# Patient Record
Sex: Female | Born: 1970 | Hispanic: No | Marital: Married | Smoking: Never smoker
Health system: Southern US, Community
[De-identification: ages and names within clinical notes are randomized; demographics above are authoritative.]

## PROBLEM LIST (undated history)

## (undated) DIAGNOSIS — D649 Anemia, unspecified: Secondary | ICD-10-CM

## (undated) DIAGNOSIS — Z202 Contact with and (suspected) exposure to infections with a predominantly sexual mode of transmission: Secondary | ICD-10-CM

## (undated) DIAGNOSIS — B9689 Other specified bacterial agents as the cause of diseases classified elsewhere: Secondary | ICD-10-CM

## (undated) DIAGNOSIS — T7840XA Allergy, unspecified, initial encounter: Secondary | ICD-10-CM

## (undated) DIAGNOSIS — K219 Gastro-esophageal reflux disease without esophagitis: Secondary | ICD-10-CM

## (undated) DIAGNOSIS — IMO0002 Reserved for concepts with insufficient information to code with codable children: Secondary | ICD-10-CM

## (undated) DIAGNOSIS — N83202 Unspecified ovarian cyst, left side: Secondary | ICD-10-CM

## (undated) DIAGNOSIS — E785 Hyperlipidemia, unspecified: Secondary | ICD-10-CM

## (undated) DIAGNOSIS — N200 Calculus of kidney: Secondary | ICD-10-CM

## (undated) DIAGNOSIS — Z8639 Personal history of other endocrine, nutritional and metabolic disease: Secondary | ICD-10-CM

## (undated) DIAGNOSIS — N760 Acute vaginitis: Secondary | ICD-10-CM

## (undated) DIAGNOSIS — R896 Abnormal cytological findings in specimens from other organs, systems and tissues: Secondary | ICD-10-CM

## (undated) DIAGNOSIS — Z87898 Personal history of other specified conditions: Secondary | ICD-10-CM

## (undated) DIAGNOSIS — E119 Type 2 diabetes mellitus without complications: Secondary | ICD-10-CM

## (undated) DIAGNOSIS — N870 Mild cervical dysplasia: Secondary | ICD-10-CM

## (undated) DIAGNOSIS — R638 Other symptoms and signs concerning food and fluid intake: Secondary | ICD-10-CM

## (undated) HISTORY — DX: Other symptoms and signs concerning food and fluid intake: R63.8

## (undated) HISTORY — DX: Type 2 diabetes mellitus without complications: E11.9

## (undated) HISTORY — DX: Allergy, unspecified, initial encounter: T78.40XA

## (undated) HISTORY — DX: Personal history of other endocrine, nutritional and metabolic disease: Z86.39

## (undated) HISTORY — DX: Contact with and (suspected) exposure to infections with a predominantly sexual mode of transmission: Z20.2

## (undated) HISTORY — DX: Personal history of other specified conditions: Z87.898

## (undated) HISTORY — DX: Mild cervical dysplasia: N87.0

## (undated) HISTORY — DX: Unspecified ovarian cyst, left side: N83.202

## (undated) HISTORY — DX: Acute vaginitis: N76.0

## (undated) HISTORY — DX: Abnormal cytological findings in specimens from other organs, systems and tissues: R89.6

## (undated) HISTORY — DX: Other specified bacterial agents as the cause of diseases classified elsewhere: B96.89

## (undated) HISTORY — DX: Hyperlipidemia, unspecified: E78.5

## (undated) HISTORY — DX: Anemia, unspecified: D64.9

## (undated) HISTORY — PX: OTHER SURGICAL HISTORY: SHX169

## (undated) HISTORY — DX: Gastro-esophageal reflux disease without esophagitis: K21.9

## (undated) HISTORY — DX: Reserved for concepts with insufficient information to code with codable children: IMO0002

---

## 1994-04-10 DIAGNOSIS — N83202 Unspecified ovarian cyst, left side: Secondary | ICD-10-CM

## 1994-04-10 HISTORY — DX: Unspecified ovarian cyst, left side: N83.202

## 1995-04-11 DIAGNOSIS — Z8639 Personal history of other endocrine, nutritional and metabolic disease: Secondary | ICD-10-CM

## 1995-04-11 DIAGNOSIS — IMO0001 Reserved for inherently not codable concepts without codable children: Secondary | ICD-10-CM

## 1995-04-11 DIAGNOSIS — Z202 Contact with and (suspected) exposure to infections with a predominantly sexual mode of transmission: Secondary | ICD-10-CM

## 1995-04-11 HISTORY — DX: Personal history of other endocrine, nutritional and metabolic disease: Z86.39

## 1995-04-11 HISTORY — DX: Reserved for inherently not codable concepts without codable children: IMO0001

## 1995-04-11 HISTORY — DX: Contact with and (suspected) exposure to infections with a predominantly sexual mode of transmission: Z20.2

## 1997-04-10 HISTORY — PX: LAPAROSCOPY: SHX197

## 1997-09-14 ENCOUNTER — Other Ambulatory Visit: Admission: RE | Admit: 1997-09-14 | Discharge: 1997-09-14 | Payer: Self-pay | Admitting: Obstetrics and Gynecology

## 1997-11-23 ENCOUNTER — Other Ambulatory Visit: Admission: RE | Admit: 1997-11-23 | Discharge: 1997-11-23 | Payer: Self-pay | Admitting: Obstetrics and Gynecology

## 1998-01-01 ENCOUNTER — Ambulatory Visit (HOSPITAL_COMMUNITY): Admission: RE | Admit: 1998-01-01 | Discharge: 1998-01-01 | Payer: Self-pay | Admitting: Obstetrics and Gynecology

## 1998-04-28 ENCOUNTER — Other Ambulatory Visit: Admission: RE | Admit: 1998-04-28 | Discharge: 1998-04-28 | Payer: Self-pay | Admitting: Obstetrics and Gynecology

## 1999-04-11 DIAGNOSIS — B9689 Other specified bacterial agents as the cause of diseases classified elsewhere: Secondary | ICD-10-CM

## 1999-04-11 DIAGNOSIS — N760 Acute vaginitis: Secondary | ICD-10-CM

## 1999-04-11 HISTORY — DX: Other specified bacterial agents as the cause of diseases classified elsewhere: B96.89

## 1999-04-11 HISTORY — DX: Other specified bacterial agents as the cause of diseases classified elsewhere: N76.0

## 1999-05-12 ENCOUNTER — Other Ambulatory Visit: Admission: RE | Admit: 1999-05-12 | Discharge: 1999-05-12 | Payer: Self-pay | Admitting: Obstetrics and Gynecology

## 2000-05-23 ENCOUNTER — Other Ambulatory Visit: Admission: RE | Admit: 2000-05-23 | Discharge: 2000-05-23 | Payer: Self-pay | Admitting: Obstetrics and Gynecology

## 2000-12-18 ENCOUNTER — Encounter: Admission: RE | Admit: 2000-12-18 | Discharge: 2001-03-18 | Payer: Self-pay | Admitting: Obstetrics and Gynecology

## 2000-12-27 ENCOUNTER — Inpatient Hospital Stay (HOSPITAL_COMMUNITY): Admission: AD | Admit: 2000-12-27 | Discharge: 2000-12-29 | Payer: Self-pay | Admitting: Obstetrics and Gynecology

## 2002-01-28 ENCOUNTER — Other Ambulatory Visit: Admission: RE | Admit: 2002-01-28 | Discharge: 2002-01-28 | Payer: Self-pay | Admitting: Obstetrics and Gynecology

## 2002-04-10 DIAGNOSIS — N870 Mild cervical dysplasia: Secondary | ICD-10-CM

## 2002-04-10 HISTORY — DX: Mild cervical dysplasia: N87.0

## 2002-08-05 ENCOUNTER — Other Ambulatory Visit: Admission: RE | Admit: 2002-08-05 | Discharge: 2002-08-05 | Payer: Self-pay | Admitting: Obstetrics and Gynecology

## 2003-06-12 ENCOUNTER — Emergency Department (HOSPITAL_COMMUNITY): Admission: EM | Admit: 2003-06-12 | Discharge: 2003-06-12 | Payer: Self-pay | Admitting: *Deleted

## 2003-08-05 ENCOUNTER — Other Ambulatory Visit: Admission: RE | Admit: 2003-08-05 | Discharge: 2003-08-05 | Payer: Self-pay | Admitting: Obstetrics and Gynecology

## 2004-04-10 DIAGNOSIS — R638 Other symptoms and signs concerning food and fluid intake: Secondary | ICD-10-CM

## 2004-04-10 HISTORY — DX: Other symptoms and signs concerning food and fluid intake: R63.8

## 2004-09-01 ENCOUNTER — Other Ambulatory Visit: Admission: RE | Admit: 2004-09-01 | Discharge: 2004-09-01 | Payer: Self-pay | Admitting: Obstetrics and Gynecology

## 2005-09-06 ENCOUNTER — Other Ambulatory Visit: Admission: RE | Admit: 2005-09-06 | Discharge: 2005-09-06 | Payer: Self-pay | Admitting: Obstetrics and Gynecology

## 2005-09-14 ENCOUNTER — Ambulatory Visit (HOSPITAL_COMMUNITY): Admission: RE | Admit: 2005-09-14 | Discharge: 2005-09-14 | Payer: Self-pay | Admitting: Obstetrics and Gynecology

## 2008-04-10 DIAGNOSIS — Z87898 Personal history of other specified conditions: Secondary | ICD-10-CM

## 2008-04-10 HISTORY — DX: Personal history of other specified conditions: Z87.898

## 2008-10-18 DIAGNOSIS — IMO0002 Reserved for concepts with insufficient information to code with codable children: Secondary | ICD-10-CM | POA: Insufficient documentation

## 2010-08-15 ENCOUNTER — Other Ambulatory Visit (HOSPITAL_COMMUNITY): Payer: Self-pay | Admitting: Obstetrics and Gynecology

## 2010-08-15 DIAGNOSIS — Z1231 Encounter for screening mammogram for malignant neoplasm of breast: Secondary | ICD-10-CM

## 2010-08-25 ENCOUNTER — Ambulatory Visit (HOSPITAL_COMMUNITY)
Admission: RE | Admit: 2010-08-25 | Discharge: 2010-08-25 | Disposition: A | Discharge status: Home / Self Care | Payer: 59 | Source: Ambulatory Visit | Attending: Obstetrics and Gynecology | Admitting: Obstetrics and Gynecology

## 2010-08-25 DIAGNOSIS — Z1231 Encounter for screening mammogram for malignant neoplasm of breast: Secondary | ICD-10-CM | POA: Insufficient documentation

## 2010-08-31 ENCOUNTER — Other Ambulatory Visit: Payer: Self-pay | Admitting: Obstetrics and Gynecology

## 2010-08-31 DIAGNOSIS — R928 Other abnormal and inconclusive findings on diagnostic imaging of breast: Secondary | ICD-10-CM

## 2010-09-02 ENCOUNTER — Ambulatory Visit
Admission: RE | Admit: 2010-09-02 | Discharge: 2010-09-02 | Disposition: A | Discharge status: Home / Self Care | Payer: 59 | Source: Ambulatory Visit | Attending: Obstetrics and Gynecology | Admitting: Obstetrics and Gynecology

## 2010-09-02 DIAGNOSIS — R928 Other abnormal and inconclusive findings on diagnostic imaging of breast: Secondary | ICD-10-CM

## 2011-08-03 ENCOUNTER — Telehealth: Payer: Self-pay | Admitting: Obstetrics and Gynecology

## 2011-08-03 NOTE — Telephone Encounter (Signed)
Upcoming appt in July/last seen in 2010

## 2011-08-28 ENCOUNTER — Other Ambulatory Visit: Payer: Self-pay | Admitting: Obstetrics and Gynecology

## 2011-08-28 DIAGNOSIS — Z1231 Encounter for screening mammogram for malignant neoplasm of breast: Secondary | ICD-10-CM

## 2011-09-21 ENCOUNTER — Ambulatory Visit (HOSPITAL_COMMUNITY)
Admission: RE | Admit: 2011-09-21 | Discharge: 2011-09-21 | Disposition: A | Discharge status: Home / Self Care | Payer: 59 | Source: Ambulatory Visit | Attending: Obstetrics and Gynecology | Admitting: Obstetrics and Gynecology

## 2011-09-21 DIAGNOSIS — Z1231 Encounter for screening mammogram for malignant neoplasm of breast: Secondary | ICD-10-CM

## 2011-10-19 ENCOUNTER — Ambulatory Visit (INDEPENDENT_AMBULATORY_CARE_PROVIDER_SITE_OTHER): Payer: 59 | Admitting: Obstetrics and Gynecology

## 2011-10-19 ENCOUNTER — Encounter: Payer: Self-pay | Admitting: Obstetrics and Gynecology

## 2011-10-19 VITALS — BP 126/72 | Ht 65.0 in | Wt 281.0 lb

## 2011-10-19 DIAGNOSIS — IMO0002 Reserved for concepts with insufficient information to code with codable children: Secondary | ICD-10-CM

## 2011-10-19 DIAGNOSIS — Z9889 Other specified postprocedural states: Secondary | ICD-10-CM

## 2011-10-19 DIAGNOSIS — Z87898 Personal history of other specified conditions: Secondary | ICD-10-CM

## 2011-10-19 DIAGNOSIS — Z124 Encounter for screening for malignant neoplasm of cervix: Secondary | ICD-10-CM

## 2011-10-19 DIAGNOSIS — Z8742 Personal history of other diseases of the female genital tract: Secondary | ICD-10-CM

## 2011-10-19 DIAGNOSIS — Z01419 Encounter for gynecological examination (general) (routine) without abnormal findings: Secondary | ICD-10-CM

## 2011-10-19 DIAGNOSIS — E669 Obesity, unspecified: Secondary | ICD-10-CM

## 2011-10-19 DIAGNOSIS — R87811 Vaginal high risk human papillomavirus (HPV) DNA test positive: Secondary | ICD-10-CM

## 2011-10-19 HISTORY — DX: Encounter for gynecological examination (general) (routine) without abnormal findings: Z01.419

## 2011-10-19 LAB — COMPREHENSIVE METABOLIC PANEL
AST: 14 U/L (ref 0–37)
BUN: 7 mg/dL (ref 6–23)
CO2: 25 mEq/L (ref 19–32)
Sodium: 138 mEq/L (ref 135–145)
Total Bilirubin: 0.4 mg/dL (ref 0.3–1.2)
Total Protein: 7.7 g/dL (ref 6.0–8.3)

## 2011-10-19 LAB — CBC
HCT: 37.6 % (ref 36.0–46.0)
Hemoglobin: 12.6 g/dL (ref 12.0–15.0)
MCH: 29.8 pg (ref 26.0–34.0)
MCHC: 33.5 g/dL (ref 30.0–36.0)
RDW: 13.8 % (ref 11.5–15.5)
WBC: 7.1 10*3/uL (ref 4.0–10.5)

## 2011-10-19 LAB — LIPID PANEL
Cholesterol: 152 mg/dL (ref 0–200)
HDL: 40 mg/dL (ref >39)
LDL Cholesterol: 88 mg/dL (ref 0–99)
Triglycerides: 118 mg/dL (ref <150)
VLDL: 24 mg/dL (ref 0–40)

## 2011-10-19 NOTE — Progress Notes (Signed)
Last Pap: 10/01/2008 WNL: No ASCUS/HPV Regular Periods:yes Contraception: NONE .  Not sexually active Monthly Breast exam:no Tetanus<36yrs:no Nl.Bladder Function:yes Daily BMs:yes Healthy Diet:yes Calcium:no Mammogram:yes Date of Mammogram: 09/2011 WNL Exercise:yes Have often Exercise: 3 TIMES WEEKLY Seatbelt: yes Abuse at home: no Stressful work:yes Sigmoid-colonoscopy: N/A Bone Density: No PCP: NONE Change in PMH: NONE Change in [facility]: positive family history of diabetes hypertension and hyperlipidemia  Subjective:    Samantha Simpson is a 41 y.o. female, G1P0101, who presents for an annual exam. She notes her periods are longer. She denies any dizziness fatigue syncope. She does have a family history of diabetes hyper-lipidemia and hypertension.    History   Social History  . Marital Status: Single    Spouse Name: N/A    Number of Children: N/A  . Years of Education: N/A   Social History Main Topics  . Smoking status: Never Smoker   . Smokeless tobacco: Never Used  . Alcohol Use: No  . Drug Use: No  . Sexually Active: Yes    Birth Control/ Protection: None   Other Topics Concern  . None   Social History Narrative  . None    Menstrual cycle:   LMP: Patient's last menstrual period was 10/11/2011.           Cycle: monthly with no intermenstrual bleeding. Cycles last 7 days with heavy bleeding for all of these days changing pads every 2 hours. No intermenstrual bleeding no significant cramps. No soiling of clothes her sheets.  The following portions of the patient's history were reviewed and updated as appropriate: allergies, current medications, past family history, past medical history, past social history, past surgical history and problem list.  Review of Systems Pertinent items are noted in HPI. Breast:Negative for breast lump,nipple discharge or nipple retraction Gastrointestinal: Negative for abdominal pain, change in bowel habits or rectal  bleeding Urinary:negative   Objective:    BP 126/72  Ht 5\' 5"  (1.651 m)  Wt 281 lb (127.461 kg)  BMI 46.76 kg/m2  LMP 10/11/2011    Weight:  Wt Readings from Last 1 Encounters:  10/19/11 281 lb (127.461 kg)          BMI: Body mass index is 46.76 kg/(m^2).  General Appearance: Alert, appropriate appearance for age. No acute distress HEENT: Grossly normal Neck / Thyroid: Supple, no masses, nodes or enlargement Lungs: clear to auscultation bilaterally Back: No CVA tenderness Breast Exam: No masses or nodes.No dimpling, nipple retraction or discharge. Cardiovascular: Regular rate and rhythm. S1, S2, no murmur Gastrointestinal: Soft, non-tender, no masses or organomegaly Pelvic Exam: External genitalia: normal general appearance Vaginal: normal mucosa without prolapse or lesions Cervix: normal appearance Adnexa: no masses noted exam compromised by patient habitus Uterus: does not feel enlarged but examination compromised by patient habitus Rectovaginal: normal rectal, no masses Lymphatic Exam: Non-palpable nodes in neck, clavicular, axillary, or inguinal regions Skin: no rash or abnormalities Neurologic: Normal gait and speech, no tremor  Psychiatric: Alert and oriented, appropriate affect.   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

## 2011-10-23 LAB — PAP IG W/ RFLX HPV ASCU

## 2011-10-24 LAB — HUMAN PAPILLOMAVIRUS, HIGH RISK: HPV DNA High Risk: NOT DETECTED

## 2011-10-31 ENCOUNTER — Encounter: Payer: Self-pay | Admitting: Obstetrics and Gynecology

## 2011-11-01 ENCOUNTER — Telehealth: Payer: Self-pay | Admitting: Obstetrics and Gynecology

## 2011-11-01 NOTE — Telephone Encounter (Signed)
Tc from pt per test results. Told pt CBC,CMP, and lipid panel=all wnl. Told pt pap smear=ASCUS and still awaiting HPV testing result. Pt with h/o abn pap smear and voices understanding. Pt agrees.

## 2011-11-01 NOTE — Telephone Encounter (Signed)
Lm on vm to cb per telephone call.  

## 2011-11-01 NOTE — Telephone Encounter (Signed)
chandra/epic 

## 2014-02-09 ENCOUNTER — Encounter: Payer: Self-pay | Admitting: Obstetrics and Gynecology

## 2016-02-21 ENCOUNTER — Encounter (HOSPITAL_COMMUNITY): Payer: Self-pay | Admitting: Emergency Medicine

## 2016-02-21 ENCOUNTER — Emergency Department (HOSPITAL_COMMUNITY)
Admission: EM | Admit: 2016-02-21 | Discharge: 2016-02-22 | Disposition: A | Discharge status: Home / Self Care | Payer: BLUE CROSS/BLUE SHIELD | Attending: Emergency Medicine | Admitting: Emergency Medicine

## 2016-02-21 ENCOUNTER — Emergency Department (HOSPITAL_COMMUNITY): Payer: BLUE CROSS/BLUE SHIELD

## 2016-02-21 DIAGNOSIS — R319 Hematuria, unspecified: Secondary | ICD-10-CM | POA: Diagnosis not present

## 2016-02-21 DIAGNOSIS — N390 Urinary tract infection, site not specified: Secondary | ICD-10-CM | POA: Diagnosis not present

## 2016-02-21 DIAGNOSIS — N201 Calculus of ureter: Secondary | ICD-10-CM | POA: Insufficient documentation

## 2016-02-21 DIAGNOSIS — N132 Hydronephrosis with renal and ureteral calculous obstruction: Secondary | ICD-10-CM

## 2016-02-21 DIAGNOSIS — R109 Unspecified abdominal pain: Secondary | ICD-10-CM | POA: Diagnosis present

## 2016-02-21 DIAGNOSIS — N1330 Unspecified hydronephrosis: Secondary | ICD-10-CM | POA: Diagnosis not present

## 2016-02-21 HISTORY — DX: Calculus of kidney: N20.0

## 2016-02-21 LAB — COMPREHENSIVE METABOLIC PANEL
ALT: 16 U/L (ref 14–54)
ANION GAP: 9 (ref 5–15)
AST: 19 U/L (ref 15–41)
Albumin: 3.8 g/dL (ref 3.5–5.0)
Alkaline Phosphatase: 58 U/L (ref 38–126)
BUN: 7 mg/dL (ref 6–20)
CHLORIDE: 107 mmol/L (ref 101–111)
CO2: 22 mmol/L (ref 22–32)
CREATININE: 0.77 mg/dL (ref 0.44–1.00)
Calcium: 9.2 mg/dL (ref 8.9–10.3)
Glucose, Bld: 170 mg/dL — ABNORMAL HIGH (ref 65–99)
Potassium: 3.4 mmol/L — ABNORMAL LOW (ref 3.5–5.1)
Sodium: 138 mmol/L (ref 135–145)
Total Bilirubin: 0.4 mg/dL (ref 0.3–1.2)
Total Protein: 7.7 g/dL (ref 6.5–8.1)

## 2016-02-21 LAB — CBC WITH DIFFERENTIAL/PLATELET
Basophils Absolute: 0 10*3/uL (ref 0.0–0.1)
Basophils Relative: 0 %
EOS ABS: 0.1 10*3/uL (ref 0.0–0.7)
EOS PCT: 1 %
HCT: 33.5 % — ABNORMAL LOW (ref 36.0–46.0)
Hemoglobin: 10.4 g/dL — ABNORMAL LOW (ref 12.0–15.0)
LYMPHS ABS: 2.9 10*3/uL (ref 0.7–4.0)
LYMPHS PCT: 27 %
MCH: 25.9 pg — AB (ref 26.0–34.0)
MCHC: 31 g/dL (ref 30.0–36.0)
MCV: 83.3 fL (ref 78.0–100.0)
MONO ABS: 0.6 10*3/uL (ref 0.1–1.0)
MONOS PCT: 5 %
Neutro Abs: 7.1 10*3/uL (ref 1.7–7.7)
Neutrophils Relative %: 67 %
PLATELETS: 455 10*3/uL — AB (ref 150–400)
RBC: 4.02 MIL/uL (ref 3.87–5.11)
RDW: 15.7 % — AB (ref 11.5–15.5)
WBC: 10.7 10*3/uL — ABNORMAL HIGH (ref 4.0–10.5)

## 2016-02-21 LAB — URINALYSIS, ROUTINE W REFLEX MICROSCOPIC
BILIRUBIN URINE: NEGATIVE
Glucose, UA: NEGATIVE mg/dL
KETONES UR: NEGATIVE mg/dL
Nitrite: NEGATIVE
PH: 6 (ref 5.0–8.0)
Protein, ur: 100 mg/dL — AB
SPECIFIC GRAVITY, URINE: 1.021 (ref 1.005–1.030)

## 2016-02-21 LAB — URINE MICROSCOPIC-ADD ON

## 2016-02-21 LAB — POC URINE PREG, ED: Preg Test, Ur: NEGATIVE

## 2016-02-21 MED ORDER — OXYCODONE-ACETAMINOPHEN 5-325 MG PO TABS
ORAL_TABLET | ORAL | Status: AC
  Filled 2016-02-21: qty 1

## 2016-02-21 MED ORDER — OXYCODONE-ACETAMINOPHEN 5-325 MG PO TABS
1.0000 | ORAL_TABLET | Freq: Once | ORAL | Status: AC
  Administered 2016-02-21: 1 via ORAL

## 2016-02-21 MED ORDER — ONDANSETRON HCL 4 MG/2ML IJ SOLN
4.0000 mg | Freq: Once | INTRAMUSCULAR | Status: AC
  Administered 2016-02-22: 4 mg via INTRAVENOUS
  Filled 2016-02-21: qty 2

## 2016-02-21 MED ORDER — MORPHINE SULFATE (PF) 4 MG/ML IV SOLN
4.0000 mg | Freq: Once | INTRAVENOUS | Status: AC
  Administered 2016-02-22: 4 mg via INTRAVENOUS
  Filled 2016-02-21: qty 1

## 2016-02-21 MED ORDER — KETOROLAC TROMETHAMINE 30 MG/ML IJ SOLN
30.0000 mg | Freq: Once | INTRAMUSCULAR | Status: AC
  Administered 2016-02-22: 30 mg via INTRAVENOUS
  Filled 2016-02-21: qty 1

## 2016-02-21 MED ORDER — KETOROLAC TROMETHAMINE 30 MG/ML IJ SOLN: 30.0000 mg | Freq: Once | INTRAMUSCULAR | Status: DC

## 2016-02-21 NOTE — ED Triage Notes (Signed)
Pt. reports left flank pain onset last week , denies hematuria , no emesis or fever .

## 2016-02-21 NOTE — ED Provider Notes (Signed)
[facility]-EMERGENCY DEPT Provider Note   CSN: [id number] Arrival date & time: 02/21/16  2145  History   Chief Complaint Chief Complaint  Patient presents with  . Flank Pain    HPI Samantha Simpson is a 45 y.o. female.  HPI   Patient with PMH of  ASCUS, BV, CIN I, increased BMI, renal stones, ovarian cyst comes to the ED with complaint of acute onset of left flank pain that started last week. She does not believe she has seen any hematuria. She denies having any fatigue, diarrhea, or fever. Denies having vaginal discharge or bleeding. Prior to arriving to the ED she had not had any vomiting but has had 3 episodes due to severe pain since being in the ED.  Past Medical History:  Diagnosis Date  . ASCUS (atypical squamous cells of undetermined significance) on Pap smear 1997  . BV (bacterial vaginosis) 2001  . CIN I (cervical intraepithelial neoplasia I) 2004  . Exposure to chlamydia, mucopurulent cervitis/nongonococcal urethritis 1997  . H/O fatigue 2010  . H/O: obesity 1997  . HGSIL (high grade squamous intraepithelial dysplasia)   . Increased BMI 2006  . Ovarian cyst, left 1996  . Renal stones     Patient Active Problem List   Diagnosis Date Noted  . Routine gynecological examination 10/19/2011  . Obesity 10/19/2011  . ASCUS with positive high risk HPV 10/18/2008    Past Surgical History:  Procedure Laterality Date  . CKC    . LAPAROSCOPY      OB History    Gravida Para Term Preterm AB Living   1 1   1   1    SAB TAB Ectopic Multiple Live Births           1       Home Medications    Prior to Admission medications   Not on File    Family History Family History  Problem Relation Age of Onset  . Diabetes Mother   . Hypertension Mother     Social History Social History  Substance Use Topics  . Smoking status: Never Smoker  . Smokeless tobacco: Never Used  . Alcohol use No     Allergies   Penicillins   Review of Systems Review of  Systems  Review of Systems All other systems negative except as documented in the HPI. All pertinent positives and negatives as reviewed in the HPI.  Physical Exam Updated Vital Signs BP 131/80   Pulse 86   Temp 97.4 F (36.3 C) (Oral)   Resp 18   LMP 01/28/2016 (Approximate)   SpO2 97%   Physical Exam  Constitutional: She appears well-developed and well-nourished. She appears distressed.  HENT:  Head: Normocephalic and atraumatic.  Eyes: Conjunctivae are normal. Pupils are equal, round, and reactive to light.  Neck: Trachea normal, normal range of motion and full passive range of motion without pain. Neck supple.  Cardiovascular: Normal rate, regular rhythm and normal pulses.   Pulmonary/Chest: Effort normal and breath sounds normal. Chest wall is not dull to percussion. She exhibits no tenderness, no crepitus, no edema, no deformity and no retraction.  Abdominal: Soft. Normal appearance and bowel sounds are normal. There is CVA tenderness (left). There is no rigidity, no rebound, no guarding and negative Murphy's sign.  Musculoskeletal: Normal range of motion.  Neurological: She is alert. She has normal strength.  Skin: Skin is warm, dry and intact.  Psychiatric: She has a normal mood and affect. Her speech is normal and  behavior is normal. Judgment and thought content normal. Cognition and memory are normal.     ED Treatments / Results  Labs (all labs ordered are listed, but only abnormal results are displayed) Labs Reviewed  URINALYSIS, ROUTINE W REFLEX MICROSCOPIC (NOT AT [facility]) - Abnormal; Notable for the following:       Result Value   Color, Urine AMBER (*)    APPearance TURBID (*)    Hgb urine dipstick LARGE (*)    Protein, ur 100 (*)    Leukocytes, UA LARGE (*)    All other components within normal limits  CBC WITH DIFFERENTIAL/PLATELET - Abnormal; Notable for the following:    WBC 10.7 (*)    Hemoglobin 10.4 (*)    HCT 33.5 (*)    MCH 25.9 (*)    RDW 15.7 (*)     Platelets 455 (*)    All other components within normal limits  COMPREHENSIVE METABOLIC PANEL - Abnormal; Notable for the following:    Potassium 3.4 (*)    Glucose, Bld 170 (*)    All other components within normal limits  URINE MICROSCOPIC-ADD ON - Abnormal; Notable for the following:    Squamous Epithelial / LPF TOO NUMEROUS TO COUNT (*)    Bacteria, UA FEW (*)    All other components within normal limits  URINALYSIS, ROUTINE W REFLEX MICROSCOPIC (NOT AT [facility]) - Abnormal; Notable for the following:    APPearance CLOUDY (*)    Glucose, UA 250 (*)    Hgb urine dipstick SMALL (*)    Ketones, ur 40 (*)    Protein, ur 30 (*)    Leukocytes, UA MODERATE (*)    All other components within normal limits  URINE MICROSCOPIC-ADD ON - Abnormal; Notable for the following:    Squamous Epithelial / LPF 0-5 (*)    Bacteria, UA RARE (*)    All other components within normal limits  URINALYSIS, ROUTINE W REFLEX MICROSCOPIC (NOT AT [facility])  POC URINE PREG, ED    EKG  EKG Interpretation None       Radiology Ct Renal Stone Study  Result Date: 02/22/2016 CLINICAL DATA:  Left flank pain for 1 week. No hematuria. Past history of renal stones. EXAM: CT ABDOMEN AND PELVIS WITHOUT CONTRAST TECHNIQUE: Multidetector CT imaging of the abdomen and pelvis was performed following the standard protocol without IV contrast. COMPARISON:  06/12/2003 CT FINDINGS: Lower chest: No acute abnormality. Hepatobiliary: No focal liver abnormality is seen. No gallstones, gallbladder wall thickening, or biliary dilatation. Pancreas: Unremarkable. No pancreatic ductal dilatation or surrounding inflammatory changes. Spleen: Normal in size without focal abnormality. Adrenals/Urinary Tract: Bilateral normal appearing adrenal glands. There is mild nephromegaly with perinephric fat stranding on the left with mild to moderate left-sided hydroureteronephrosis secondary to a 3 mm left ureteral stone at the pelvic brim. No  nephrolithiasis. No bladder calculi. Stomach/Bowel: Stomach is within normal limits. Appendix appears normal. No evidence of bowel wall thickening, distention, or inflammatory changes. Diffuse spasm of the right colon. Vascular/Lymphatic: No significant vascular findings are present. No enlarged abdominal or pelvic lymph nodes. Reproductive: 4 cm left ovarian cyst with Hounsfield unit of 15. Unremarkable right ovary and uterus. Other: No abdominal wall hernia or abnormality. No abdominopelvic ascites. Musculoskeletal: No acute or significant osseous findings. IMPRESSION: 3 mm mid left ureteral stone at the pelvic brim causing mild-to-moderate hydroureteronephrosis and left-sided nephromegaly. 4 cm left ovarian cysts without worrisome features. No additional follow-up for incidental adnexal cystic masses less than or equal  to 5 cm in premenopausal patients. Electronically Signed   By: Tollie Eth M.D.   On: 02/22/2016 00:24    Procedures Procedures (including critical care time)  Medications Ordered in ED Medications  cefTRIAXone (ROCEPHIN) 1 g in dextrose 5 % 50 mL IVPB (1 g Intravenous New Bag/Given 02/22/16 0257)  oxyCODONE-acetaminophen (PERCOCET/ROXICET) 5-325 MG per tablet 1 tablet (1 tablet Oral Given 02/21/16 2155)  ondansetron (ZOFRAN) injection 4 mg (4 mg Intravenous Given 02/22/16 0034)  morphine 4 MG/ML injection 4 mg (4 mg Intravenous Given 02/22/16 0029)  ketorolac (TORADOL) 30 MG/ML injection 30 mg (30 mg Intravenous Given 02/22/16 0034)  morphine 4 MG/ML injection 4 mg (4 mg Intravenous Given 02/22/16 0257)     Initial Impression / Assessment and Plan / ED Course  I have reviewed the triage vital signs and the nursing notes.  Pertinent labs & imaging results that were available during my care of the patient were reviewed by me and considered in my medical decision making (see chart for details).  Clinical Course     Case Discussed with Dr. Annabell Howells, we reviewed labs/UA together.  Patient has UTI with 3mm stone in the pelvic rim. No signs of systemic toxicity. Plan is to give a gram of rocephin, start on Bactrim, pt to see Urologist in the morning, once it opens around 8am. Discussed case with patient, she is currently feeling much improved in her pain. Rx Percocet and Bactrim  Final Clinical Impressions(s) / ED Diagnoses   Final diagnoses:  Ureteral stone with hydronephrosis  Urinary tract infection with hematuria, site unspecified    New Prescriptions New Prescriptions   No medications on file     Marlon Pel, PA-C 02/22/16 0322    Layla Maw Ward, DO 02/22/16 0405

## 2016-02-22 DIAGNOSIS — Z833 Family history of diabetes mellitus: Secondary | ICD-10-CM | POA: Diagnosis not present

## 2016-02-22 DIAGNOSIS — N179 Acute kidney failure, unspecified: Secondary | ICD-10-CM | POA: Diagnosis not present

## 2016-02-22 DIAGNOSIS — R509 Fever, unspecified: Secondary | ICD-10-CM | POA: Diagnosis not present

## 2016-02-22 DIAGNOSIS — N201 Calculus of ureter: Secondary | ICD-10-CM | POA: Diagnosis not present

## 2016-02-22 DIAGNOSIS — N132 Hydronephrosis with renal and ureteral calculous obstruction: Secondary | ICD-10-CM | POA: Diagnosis not present

## 2016-02-22 DIAGNOSIS — Z87442 Personal history of urinary calculi: Secondary | ICD-10-CM | POA: Diagnosis not present

## 2016-02-22 DIAGNOSIS — A419 Sepsis, unspecified organism: Secondary | ICD-10-CM | POA: Diagnosis not present

## 2016-02-22 DIAGNOSIS — N200 Calculus of kidney: Secondary | ICD-10-CM | POA: Diagnosis not present

## 2016-02-22 DIAGNOSIS — R1032 Left lower quadrant pain: Secondary | ICD-10-CM | POA: Diagnosis not present

## 2016-02-22 DIAGNOSIS — R112 Nausea with vomiting, unspecified: Secondary | ICD-10-CM | POA: Diagnosis not present

## 2016-02-22 DIAGNOSIS — Z8249 Family history of ischemic heart disease and other diseases of the circulatory system: Secondary | ICD-10-CM | POA: Diagnosis not present

## 2016-02-22 DIAGNOSIS — N10 Acute tubulo-interstitial nephritis: Secondary | ICD-10-CM | POA: Diagnosis not present

## 2016-02-22 DIAGNOSIS — N134 Hydroureter: Secondary | ICD-10-CM | POA: Diagnosis not present

## 2016-02-22 DIAGNOSIS — R51 Headache: Secondary | ICD-10-CM | POA: Diagnosis not present

## 2016-02-22 DIAGNOSIS — Z880 Allergy status to penicillin: Secondary | ICD-10-CM | POA: Diagnosis not present

## 2016-02-22 DIAGNOSIS — Z6839 Body mass index (BMI) 39.0-39.9, adult: Secondary | ICD-10-CM | POA: Diagnosis not present

## 2016-02-22 LAB — URINALYSIS, ROUTINE W REFLEX MICROSCOPIC
Bilirubin Urine: NEGATIVE
GLUCOSE, UA: 250 mg/dL — AB
KETONES UR: 40 mg/dL — AB
Nitrite: NEGATIVE
PROTEIN: 30 mg/dL — AB
Specific Gravity, Urine: 1.013 (ref 1.005–1.030)
pH: 7.5 (ref 5.0–8.0)

## 2016-02-22 LAB — URINE MICROSCOPIC-ADD ON

## 2016-02-22 MED ORDER — SULFAMETHOXAZOLE-TRIMETHOPRIM 800-160 MG PO TABS: 1.0000 | ORAL_TABLET | Freq: Two times a day (BID) | ORAL | 0 refills | Status: DC

## 2016-02-22 MED ORDER — MORPHINE SULFATE (PF) 4 MG/ML IV SOLN
4.0000 mg | Freq: Once | INTRAVENOUS | Status: AC
  Administered 2016-02-22: 4 mg via INTRAVENOUS
  Filled 2016-02-22 (×2): qty 1

## 2016-02-22 MED ORDER — HYDROCODONE-ACETAMINOPHEN 5-325 MG PO TABS: 1.0000 | ORAL_TABLET | ORAL | 0 refills | Status: DC | PRN

## 2016-02-22 MED ORDER — ONDANSETRON HCL 4 MG PO TABS: 4.0000 mg | ORAL_TABLET | Freq: Four times a day (QID) | ORAL | 0 refills | Status: DC

## 2016-02-22 MED ORDER — CEFTRIAXONE SODIUM 1 G IJ SOLR
1.0000 g | Freq: Once | INTRAMUSCULAR | Status: AC
  Administered 2016-02-22: 1 g via INTRAVENOUS
  Filled 2016-02-22: qty 10

## 2016-02-22 NOTE — ED Notes (Signed)
IV attempted x2 without success.

## 2016-02-22 NOTE — ED Notes (Signed)
Pt requested to hold off on additional morphine at this time,  States pain is under control at 3/10.

## 2016-02-23 ENCOUNTER — Inpatient Hospital Stay (HOSPITAL_COMMUNITY)
Admission: EM | Admit: 2016-02-23 | Discharge: 2016-02-26 | DRG: 694 | Disposition: A | Discharge status: Home / Self Care | Payer: BLUE CROSS/BLUE SHIELD | Attending: Urology | Admitting: Urology

## 2016-02-23 ENCOUNTER — Encounter (HOSPITAL_COMMUNITY)
Admission: EM | Disposition: A | Discharge status: Home / Self Care | Payer: Self-pay | Source: Home / Self Care | Attending: Urology

## 2016-02-23 ENCOUNTER — Emergency Department (HOSPITAL_COMMUNITY): Payer: BLUE CROSS/BLUE SHIELD

## 2016-02-23 ENCOUNTER — Encounter (HOSPITAL_COMMUNITY): Payer: Self-pay | Admitting: *Deleted

## 2016-02-23 DIAGNOSIS — N201 Calculus of ureter: Secondary | ICD-10-CM | POA: Diagnosis not present

## 2016-02-23 DIAGNOSIS — R509 Fever, unspecified: Secondary | ICD-10-CM | POA: Diagnosis not present

## 2016-02-23 DIAGNOSIS — N132 Hydronephrosis with renal and ureteral calculous obstruction: Secondary | ICD-10-CM

## 2016-02-23 DIAGNOSIS — Z87442 Personal history of urinary calculi: Secondary | ICD-10-CM

## 2016-02-23 DIAGNOSIS — R1032 Left lower quadrant pain: Secondary | ICD-10-CM | POA: Diagnosis not present

## 2016-02-23 DIAGNOSIS — A419 Sepsis, unspecified organism: Secondary | ICD-10-CM | POA: Diagnosis not present

## 2016-02-23 DIAGNOSIS — Z6839 Body mass index (BMI) 39.0-39.9, adult: Secondary | ICD-10-CM

## 2016-02-23 DIAGNOSIS — Z8249 Family history of ischemic heart disease and other diseases of the circulatory system: Secondary | ICD-10-CM

## 2016-02-23 DIAGNOSIS — R51 Headache: Secondary | ICD-10-CM | POA: Diagnosis present

## 2016-02-23 DIAGNOSIS — Z880 Allergy status to penicillin: Secondary | ICD-10-CM

## 2016-02-23 DIAGNOSIS — Z833 Family history of diabetes mellitus: Secondary | ICD-10-CM

## 2016-02-23 DIAGNOSIS — R112 Nausea with vomiting, unspecified: Secondary | ICD-10-CM | POA: Diagnosis not present

## 2016-02-23 DIAGNOSIS — N200 Calculus of kidney: Secondary | ICD-10-CM | POA: Diagnosis not present

## 2016-02-23 DIAGNOSIS — N179 Acute kidney failure, unspecified: Secondary | ICD-10-CM | POA: Diagnosis present

## 2016-02-23 HISTORY — PX: CYSTOSCOPY WITH STENT PLACEMENT: SHX5790

## 2016-02-23 LAB — I-STAT CG4 LACTIC ACID, ED
LACTIC ACID, VENOUS: 2.63 mmol/L — AB (ref 0.5–1.9)
Lactic Acid, Venous: 1.31 mmol/L (ref 0.5–1.9)

## 2016-02-23 LAB — CBC WITH DIFFERENTIAL/PLATELET
BASOS ABS: 0 10*3/uL (ref 0.0–0.1)
BASOS PCT: 0 %
Eosinophils Absolute: 0 10*3/uL (ref 0.0–0.7)
Eosinophils Relative: 0 %
HEMATOCRIT: 33.7 % — AB (ref 36.0–46.0)
Hemoglobin: 11 g/dL — ABNORMAL LOW (ref 12.0–15.0)
Lymphocytes Relative: 6 %
Lymphs Abs: 0.5 10*3/uL — ABNORMAL LOW (ref 0.7–4.0)
MCH: 26.4 pg (ref 26.0–34.0)
MCHC: 32.6 g/dL (ref 30.0–36.0)
MCV: 80.8 fL (ref 78.0–100.0)
MONO ABS: 0 10*3/uL — AB (ref 0.1–1.0)
Monocytes Relative: 1 %
NEUTROS ABS: 8.2 10*3/uL — AB (ref 1.7–7.7)
NEUTROS PCT: 94 %
Platelets: 197 10*3/uL (ref 150–400)
RBC: 4.17 MIL/uL (ref 3.87–5.11)
RDW: 16.2 % — AB (ref 11.5–15.5)
WBC: 8.8 10*3/uL (ref 4.0–10.5)

## 2016-02-23 LAB — COMPREHENSIVE METABOLIC PANEL
ALBUMIN: 3.1 g/dL — AB (ref 3.5–5.0)
ALT: 17 U/L (ref 14–54)
ANION GAP: 12 (ref 5–15)
AST: 27 U/L (ref 15–41)
Alkaline Phosphatase: 72 U/L (ref 38–126)
BUN: 13 mg/dL (ref 6–20)
CHLORIDE: 100 mmol/L — AB (ref 101–111)
CO2: 19 mmol/L — AB (ref 22–32)
Calcium: 8.5 mg/dL — ABNORMAL LOW (ref 8.9–10.3)
Creatinine, Ser: 1.74 mg/dL — ABNORMAL HIGH (ref 0.44–1.00)
GFR calc Af Amer: 39 mL/min — ABNORMAL LOW (ref >60)
GFR calc non Af Amer: 34 mL/min — ABNORMAL LOW (ref >60)
GLUCOSE: 172 mg/dL — AB (ref 65–99)
POTASSIUM: 3.7 mmol/L (ref 3.5–5.1)
SODIUM: 131 mmol/L — AB (ref 135–145)
Total Bilirubin: 1.8 mg/dL — ABNORMAL HIGH (ref 0.3–1.2)
Total Protein: 7.4 g/dL (ref 6.5–8.1)

## 2016-02-23 LAB — I-STAT BETA HCG BLOOD, ED (MC, WL, AP ONLY): I-stat hCG, quantitative: <5 m[IU]/mL (ref <5)

## 2016-02-23 SURGERY — CYSTOSCOPY, WITH STENT INSERTION
Anesthesia: General | Site: Ureter | Laterality: Left

## 2016-02-23 MED ORDER — HYDROMORPHONE HCL 2 MG/ML IJ SOLN
1.0000 mg | Freq: Once | INTRAMUSCULAR | Status: AC
  Administered 2016-02-23: 1 mg via INTRAVENOUS
  Filled 2016-02-23: qty 1

## 2016-02-23 MED ORDER — DEXTROSE 5 % IV SOLN
2.0000 g | Freq: Once | INTRAVENOUS | Status: AC
  Administered 2016-02-23: 2 g via INTRAVENOUS
  Filled 2016-02-23: qty 2

## 2016-02-23 MED ORDER — ACETAMINOPHEN 325 MG PO TABS
650.0000 mg | ORAL_TABLET | Freq: Once | ORAL | Status: AC
  Administered 2016-02-23: 650 mg via ORAL
  Filled 2016-02-23: qty 2

## 2016-02-23 MED ORDER — SODIUM CHLORIDE 0.9 % IV BOLUS (SEPSIS)
1000.0000 mL | Freq: Once | INTRAVENOUS | Status: AC
  Administered 2016-02-23: 1000 mL via INTRAVENOUS

## 2016-02-23 SURGICAL SUPPLY — 10 items
BAG URO CATCHER STRL LF (MISCELLANEOUS) ×2 IMPLANT
CATH INTERMIT  6FR 70CM (CATHETERS) ×2 IMPLANT
CLOTH BEACON ORANGE TIMEOUT ST (SAFETY) ×2 IMPLANT
GLOVE BIOGEL M STRL SZ7.5 (GLOVE) ×2 IMPLANT
GOWN STRL REUS W/TWL LRG LVL3 (GOWN DISPOSABLE) ×4 IMPLANT
GUIDEWIRE STR DUAL SENSOR (WIRE) ×2 IMPLANT
MANIFOLD NEPTUNE II (INSTRUMENTS) ×2 IMPLANT
PACK CYSTO (CUSTOM PROCEDURE TRAY) ×2 IMPLANT
STENT URETERAL METAL 6X24 (Stent) ×1 IMPLANT
TUBING CONNECTING 10 (TUBING) ×2 IMPLANT

## 2016-02-23 NOTE — ED Triage Notes (Signed)
Pt seen a few days ago for UTI, started on antibiotic at that time, in tonight c/o worsened symptoms and increased fever at home, c/o headache and chills

## 2016-02-23 NOTE — ED Provider Notes (Signed)
[facility] DEPT Provider Note   CSN: [id number] Arrival date & time: 02/23/16  [id number]     History   Chief Complaint Chief Complaint  Patient presents with  . Fever    HPI Samantha Simpson is a 45 y.o. female.  The history is provided by the patient. No language interpreter was used.  Fever     Samantha Simpson is a 45 y.o. female who presents to the Emergency Department complaining of Fever. 2 days ago she presented to the emergency department for left-sided pain and was diagnosed with a 3 mm ureteral stone. There is a question of urinary tract infection and she was given a dose of Rocephin and started on Bactrim. She is instructed to follow up with urology but could not get an appointment until next Wednesday. Yesterday she was feeling poorly and laid in bed and today she developed chills. She had one episode of emesis 2 days ago but none since then. She has been taking her antibiotics as prescribed.Her flank pain has resolved. Today she has developed a headache. No neck pain or photophobia. Past Medical History:  Diagnosis Date  . ASCUS (atypical squamous cells of undetermined significance) on Pap smear 1997  . BV (bacterial vaginosis) 2001  . CIN I (cervical intraepithelial neoplasia I) 2004  . Exposure to chlamydia, mucopurulent cervitis/nongonococcal urethritis 1997  . H/O fatigue 2010  . H/O: obesity 1997  . HGSIL (high grade squamous intraepithelial dysplasia)   . Increased BMI 2006  . Ovarian cyst, left 1996  . Renal stones     Patient Active Problem List   Diagnosis Date Noted  . Routine gynecological examination 10/19/2011  . Obesity 10/19/2011  . ASCUS with positive high risk HPV 10/18/2008    Past Surgical History:  Procedure Laterality Date  . CKC    . LAPAROSCOPY      OB History    Gravida Para Term Preterm AB Living   1 1   1   1    SAB TAB Ectopic Multiple Live Births           1       Home Medications    Prior to Admission medications     Medication Sig Start Date End Date Taking? Authorizing Provider  HYDROcodone-acetaminophen (NORCO/VICODIN) 5-325 MG tablet Take 1-2 tablets by mouth every 4 (four) hours as needed. Patient taking differently: Take 1-2 tablets by mouth every 4 (four) hours as needed (for pain).  02/22/16  Yes Tiffany Neva SeatGreene, PA-C  sulfamethoxazole-trimethoprim (BACTRIM DS,SEPTRA DS) 800-160 MG tablet Take 1 tablet by mouth 2 (two) times daily. 02/22/16 02/29/16 Yes Tiffany Neva SeatGreene, PA-C  ondansetron (ZOFRAN) 4 MG tablet Take 1 tablet (4 mg total) by mouth every 6 (six) hours. Patient not taking: Reported on 02/23/2016 02/22/16   Marlon Peliffany Greene, PA-C    Family History Family History  Problem Relation Age of Onset  . Diabetes Mother   . Hypertension Mother     Social History Social History  Substance Use Topics  . Smoking status: Never Smoker  . Smokeless tobacco: Never Used  . Alcohol use No     Allergies   Penicillins   Review of Systems Review of Systems  Constitutional: Positive for fever.  All other systems reviewed and are negative.    Physical Exam Updated Vital Signs BP 136/80   Pulse 97   Temp (!) 103.1 F (39.5 C) (Oral)   Resp 23   Ht 5\' 5"  (1.651 m)   Wt 240  lb (108.9 kg)   LMP 01/28/2016 (Approximate)   SpO2 96%   BMI 39.94 kg/m   Physical Exam  Constitutional: She is oriented to person, place, and time. She appears well-developed and well-nourished.  Uncomfortable appearing  HENT:  Head: Normocephalic and atraumatic.  Cardiovascular: Regular rhythm.   No murmur heard. tachycardic  Pulmonary/Chest: Effort normal and breath sounds normal. No respiratory distress.  Abdominal: Soft. There is no tenderness. There is no rebound and no guarding.  Musculoskeletal: She exhibits no edema or tenderness.  Neurological: She is alert and oriented to person, place, and time.  Skin: Skin is warm and dry.  Psychiatric: She has a normal mood and affect. Her behavior is normal.   Nursing note and vitals reviewed.    ED Treatments / Results  Labs (all labs ordered are listed, but only abnormal results are displayed) Labs Reviewed  COMPREHENSIVE METABOLIC PANEL - Abnormal; Notable for the following:       Result Value   Sodium 131 (*)    Chloride 100 (*)    CO2 19 (*)    Glucose, Bld 172 (*)    Creatinine, Ser 1.74 (*)    Calcium 8.5 (*)    Albumin 3.1 (*)    Total Bilirubin 1.8 (*)    GFR calc non Af Amer 34 (*)    GFR calc Af Amer 39 (*)    All other components within normal limits  CBC WITH DIFFERENTIAL/PLATELET - Abnormal; Notable for the following:    Hemoglobin 11.0 (*)    HCT 33.7 (*)    RDW 16.2 (*)    Neutro Abs 8.2 (*)    Lymphs Abs 0.5 (*)    Monocytes Absolute 0.0 (*)    All other components within normal limits  I-STAT CG4 LACTIC ACID, ED - Abnormal; Notable for the following:    Lactic Acid, Venous 2.63 (*)    All other components within normal limits  CULTURE, BLOOD (ROUTINE X 2)  CULTURE, BLOOD (ROUTINE X 2)  URINE CULTURE  URINALYSIS, ROUTINE W REFLEX MICROSCOPIC (NOT AT [facility])  I-STAT BETA HCG BLOOD, ED ([facility], [facility], [facility] ONLY)  I-STAT CG4 LACTIC ACID, ED    EKG  EKG Interpretation  Date/Time:  Wednesday February 23 2016 21:01:36 EST Ventricular Rate:  106 PR Interval:    QRS Duration: 82 QT Interval:  318 QTC Calculation: 423 R Axis:   32 Text Interpretation:  Sinus tachycardia Posterior infarct, old Confirmed by Lincoln Brigham [phone]) on 02/23/2016 9:08:16 PM       Radiology Dg Chest 2 View  Result Date: 02/23/2016 CLINICAL DATA:  Fever, headache and chills.  Onset yesterday. EXAM: CHEST  2 VIEW COMPARISON:  None. FINDINGS: The lungs are clear. The pulmonary vasculature is normal. Heart size is normal. Hilar and mediastinal contours are unremarkable. There is no pleural effusion. IMPRESSION: No active cardiopulmonary disease. Electronically Signed   By: Ellery Plunk M.D.   On: 02/23/2016 21:05   Dg Abdomen 1  View  Result Date: 02/23/2016 CLINICAL DATA:  Fever and recent kidney stone EXAM: ABDOMEN - 1 VIEW COMPARISON:  CT abdomen pelvis 02/21/2016 FINDINGS: The bowel gas pattern is normal. 3-4 mm density in the left lower pelvis is favored to be a phlebolith. IMPRESSION: 3-4 mm radiopaque focus in the left lower pelvis is favored to be a phlebolith, though a stone at the left ureterovesical junction would be difficult to exclude. If there are symptoms of obstructive uropathy, CT of the abdomen and pelvis  would differentiate between the two. Electronically Signed   By: Deatra RobinsonKevin  Herman M.D.   On: 02/23/2016 22:16   Ct Renal Stone Study  Result Date: 02/22/2016 CLINICAL DATA:  Left flank pain for 1 week. No hematuria. Past history of renal stones. EXAM: CT ABDOMEN AND PELVIS WITHOUT CONTRAST TECHNIQUE: Multidetector CT imaging of the abdomen and pelvis was performed following the standard protocol without IV contrast. COMPARISON:  06/12/2003 CT FINDINGS: Lower chest: No acute abnormality. Hepatobiliary: No focal liver abnormality is seen. No gallstones, gallbladder wall thickening, or biliary dilatation. Pancreas: Unremarkable. No pancreatic ductal dilatation or surrounding inflammatory changes. Spleen: Normal in size without focal abnormality. Adrenals/Urinary Tract: Bilateral normal appearing adrenal glands. There is mild nephromegaly with perinephric fat stranding on the left with mild to moderate left-sided hydroureteronephrosis secondary to a 3 mm left ureteral stone at the pelvic brim. No nephrolithiasis. No bladder calculi. Stomach/Bowel: Stomach is within normal limits. Appendix appears normal. No evidence of bowel wall thickening, distention, or inflammatory changes. Diffuse spasm of the right colon. Vascular/Lymphatic: No significant vascular findings are present. No enlarged abdominal or pelvic lymph nodes. Reproductive: 4 cm left ovarian cyst with Hounsfield unit of 15. Unremarkable right ovary and uterus.  Other: No abdominal wall hernia or abnormality. No abdominopelvic ascites. Musculoskeletal: No acute or significant osseous findings. IMPRESSION: 3 mm mid left ureteral stone at the pelvic brim causing mild-to-moderate hydroureteronephrosis and left-sided nephromegaly. 4 cm left ovarian cysts without worrisome features. No additional follow-up for incidental adnexal cystic masses less than or equal to 5 cm in premenopausal patients. Electronically Signed   By: Tollie Ethavid  Kwon M.D.   On: 02/22/2016 00:24    Procedures Procedures (including critical care time)  Medications Ordered in ED Medications  acetaminophen (TYLENOL) tablet 650 mg (650 mg Oral Given 02/23/16 2012)  cefTRIAXone (ROCEPHIN) 2 g in dextrose 5 % 50 mL IVPB (0 g Intravenous Stopped 02/23/16 2138)  HYDROmorphone (DILAUDID) injection 1 mg (1 mg Intravenous Given 02/23/16 2200)     Initial Impression / Assessment and Plan / ED Course  I have reviewed the triage vital signs and the nursing notes.  Pertinent labs & imaging results that were available during my care of the patient were reviewed by me and considered in my medical decision making (see chart for details).  Clinical Course     Patient with history of recent ureteral stone here with fever and malaise. Concern for urinary tract infection complicated with stone given her presentation. BMP with acute kidney injury. Discussed with Dr. Laverle PatterBorden with urology who saw the patient in the emergency department. [facility] ureteral stent placement. She was treated with IV fluids and antibiotics in the emergency department for developing sepsis.  Final Clinical Impressions(s) / ED Diagnoses   Final diagnoses:  Sepsis, due to unspecified organism [facility](HCC)    New Prescriptions New Prescriptions   No medications on file     Tilden FossaElizabeth Keir Foland, MD 02/23/16 2318

## 2016-02-23 NOTE — ED Notes (Signed)
[facility] eta 1 hour

## 2016-02-23 NOTE — Consult Note (Signed)
Urology Consult   Physician requesting consult: Dr. Madilyn Hookees  Reason for consult: Left ureteral stone, fever  History of Present Illness: Samantha Simpson is a 45 y.o. who was seen in the ED on Monday with acute onset, severe left flank pain.  Samantha Simpson was diagnosed with a left ureteral stone measuring 3 mm in the mid left ureter. Samantha Simpson also was found to have a possible UTI although no culture was obtained.  Samantha Simpson was started on Bactrim.  Dr. Annabell HowellsWrenn was called and he recommended Samantha Simpson follow up with him the following morning.  Samantha Simpson was not seen in the office and returned to the ED tonight with fever to 103. Samantha Simpson states that fevers began about 24 hours ago. Samantha Simpson denies flank pain now.  Samantha Simpson has not seen a stone pass.  Samantha Simpson has had associated nausea and vomiting.     Samantha Simpson has a history of 4 prior stone episodes.  Past Medical History:  Diagnosis Date  . ASCUS (atypical squamous cells of undetermined significance) on Pap smear 1997  . BV (bacterial vaginosis) 2001  . CIN I (cervical intraepithelial neoplasia I) 2004  . Exposure to chlamydia, mucopurulent cervitis/nongonococcal urethritis 1997  . H/O fatigue 2010  . H/O: obesity 1997  . HGSIL (high grade squamous intraepithelial dysplasia)   . Increased BMI 2006  . Ovarian cyst, left 1996  . Renal stones     Past Surgical History:  Procedure Laterality Date  . CKC    . LAPAROSCOPY       Current Hospital Medications:  Home meds:  No outpatient prescriptions have been marked as taking for the 02/23/16 encounter [facility](Hospital Encounter).    Scheduled Meds: Continuous Infusions: PRN Meds:.  Allergies:  Allergies  Allergen Reactions  . Penicillins     Family History  Problem Relation Age of Onset  . Diabetes Mother   . Hypertension Mother     Social History:  reports that Samantha Simpson has never smoked. Samantha Simpson has never used smokeless tobacco. Samantha Simpson reports that Samantha Simpson does not drink alcohol or use drugs.  ROS: A complete review of systems was performed.  All  systems are negative except for pertinent findings as noted.  Physical Exam:  Vital signs in last 24 hours: Temp:  [103.1 F (39.5 C)] 103.1 F (39.5 C) (11/15 2011) Pulse Rate:  [102-125] 102 (11/15 2200) Resp:  [24-30] 30 (11/15 2200) BP: (126-174)/(76-115) 141/76 (11/15 2200) SpO2:  [96 %-100 %] 96 % (11/15 2200) Weight:  [108.9 kg (240 lb)] 108.9 kg (240 lb) (11/15 2142) Constitutional:  Alert and oriented, No acute distress Cardiovascular: Regular rhythm, mildly tachycardic, No JVD Respiratory: Normal respiratory effort, Lungs clear bilaterally GI: Abdomen is soft, nontender, nondistended, no abdominal masses GU: No CVA tenderness Lymphatic: No lymphadenopathy Neurologic: Grossly intact, no focal deficits Psychiatric: Normal mood and affect  Laboratory Data:   Recent Labs  02/21/16 2155 02/23/16 2030  WBC 10.7* 8.8  HGB 10.4* 11.0*  HCT 33.5* 33.7*  PLT 455* 197     Recent Labs  02/21/16 2155 02/23/16 2030  NA 138 131*  K 3.4* 3.7  CL 107 100*  GLUCOSE 170* 172*  BUN 7 13  CALCIUM 9.2 8.5*  CREATININE 0.77 1.74*     Results for orders placed or performed during the hospital encounter of 02/23/16 (from the past 24 hour(s))  Comprehensive metabolic panel     Status: Abnormal   Collection Time: 02/23/16  8:30 PM  Result Value Ref Range   Sodium 131 (L) 135 -  145 mmol/L   Potassium 3.7 3.5 - 5.1 mmol/L   Chloride 100 (L) 101 - 111 mmol/L   CO2 19 (L) 22 - 32 mmol/L   Glucose, Bld 172 (H) 65 - 99 mg/dL   BUN 13 6 - 20 mg/dL   Creatinine, Ser 4.011.74 (H) 0.44 - 1.00 mg/dL   Calcium 8.5 (L) 8.9 - 10.3 mg/dL   Total Protein 7.4 6.5 - 8.1 g/dL   Albumin 3.1 (L) 3.5 - 5.0 g/dL   AST 27 15 - 41 U/L   ALT 17 14 - 54 U/L   Alkaline Phosphatase 72 38 - 126 U/L   Total Bilirubin 1.8 (H) 0.3 - 1.2 mg/dL   GFR calc non Af Amer 34 (L) >60 mL/min   GFR calc Af Amer 39 (L) >60 mL/min   Anion gap 12 5 - 15  CBC with Differential     Status: Abnormal   Collection  Time: 02/23/16  8:30 PM  Result Value Ref Range   WBC 8.8 4.0 - 10.5 K/uL   RBC 4.17 3.87 - 5.11 MIL/uL   Hemoglobin 11.0 (L) 12.0 - 15.0 g/dL   HCT 02.733.7 (L) 25.336.0 - 66.446.0 %   MCV 80.8 78.0 - 100.0 fL   MCH 26.4 26.0 - 34.0 pg   MCHC 32.6 30.0 - 36.0 g/dL   RDW 40.316.2 (H) 47.411.5 - 25.915.5 %   Platelets 197 150 - 400 K/uL   Neutrophils Relative % 94 %   Neutro Abs 8.2 (H) 1.7 - 7.7 K/uL   Lymphocytes Relative 6 %   Lymphs Abs 0.5 (L) 0.7 - 4.0 K/uL   Monocytes Relative 1 %   Monocytes Absolute 0.0 (L) 0.1 - 1.0 K/uL   Eosinophils Relative 0 %   Eosinophils Absolute 0.0 0.0 - 0.7 K/uL   Basophils Relative 0 %   Basophils Absolute 0.0 0.0 - 0.1 K/uL  I-Stat beta hCG blood, ED     Status: None   Collection Time: 02/23/16  8:39 PM  Result Value Ref Range   I-stat hCG, quantitative <5.0 <5 mIU/mL   Comment 3          I-Stat CG4 Lactic Acid, ED     Status: Abnormal   Collection Time: 02/23/16  8:42 PM  Result Value Ref Range   Lactic Acid, Venous 2.63 (HH) 0.5 - 1.9 mmol/L   Comment NOTIFIED PHYSICIAN    No results found for this or any previous visit (from the past 240 hour(s)).  Renal Function:  Recent Labs  02/21/16 2155 02/23/16 2030  CREATININE 0.77 1.74*   Estimated Creatinine Clearance: 49.6 mL/min (by C-G formula based on SCr of 1.74 mg/dL (H)).  Radiologic Imaging: Dg Chest 2 View  Result Date: 02/23/2016 CLINICAL DATA:  Fever, headache and chills.  Onset yesterday. EXAM: CHEST  2 VIEW COMPARISON:  None. FINDINGS: The lungs are clear. The pulmonary vasculature is normal. Heart size is normal. Hilar and mediastinal contours are unremarkable. There is no pleural effusion. IMPRESSION: No active cardiopulmonary disease. Electronically Signed   By: Ellery Plunkaniel R Mitchell M.D.   On: 02/23/2016 21:05   Ct Renal Stone Study  Result Date: 02/22/2016 CLINICAL DATA:  Left flank pain for 1 week. No hematuria. Past history of renal stones. EXAM: CT ABDOMEN AND PELVIS WITHOUT CONTRAST  TECHNIQUE: Multidetector CT imaging of the abdomen and pelvis was performed following the standard protocol without IV contrast. COMPARISON:  06/12/2003 CT FINDINGS: Lower chest: No acute abnormality. Hepatobiliary: No focal liver abnormality  is seen. No gallstones, gallbladder wall thickening, or biliary dilatation. Pancreas: Unremarkable. No pancreatic ductal dilatation or surrounding inflammatory changes. Spleen: Normal in size without focal abnormality. Adrenals/Urinary Tract: Bilateral normal appearing adrenal glands. There is mild nephromegaly with perinephric fat stranding on the left with mild to moderate left-sided hydroureteronephrosis secondary to a 3 mm left ureteral stone at the pelvic brim. No nephrolithiasis. No bladder calculi. Stomach/Bowel: Stomach is within normal limits. Appendix appears normal. No evidence of bowel wall thickening, distention, or inflammatory changes. Diffuse spasm of the right colon. Vascular/Lymphatic: No significant vascular findings are present. No enlarged abdominal or pelvic lymph nodes. Reproductive: 4 cm left ovarian cyst with Hounsfield unit of 15. Unremarkable right ovary and uterus. Other: No abdominal wall hernia or abnormality. No abdominopelvic ascites. Musculoskeletal: No acute or significant osseous findings. IMPRESSION: 3 mm mid left ureteral stone at the pelvic brim causing mild-to-moderate hydroureteronephrosis and left-sided nephromegaly. 4 cm left ovarian cysts without worrisome features. No additional follow-up for incidental adnexal cystic masses less than or equal to 5 cm in premenopausal patients. Electronically Signed   By: Tollie Ethavid  Kwon M.D.   On: 02/22/2016 00:24    I independently reviewed the above imaging studies.  Impression/Recommendation: Left ureteral stone and fever: Samantha Simpson will proceed to the OR immediately for cystoscopy and ureteral stent tonight.  We have reviewed potential risks/complications of the procedure and Samantha Simpson gives informed  consent to proceed.  Continue ceftriaxone.  No culture is pending and will be checked in the OR.    Constanza Mincy,LES 02/23/2016, 10:16 PM  Moody BruinsLester S. Tamatha Gadbois Jr. MD   CC: Dr. Madilyn Hookees

## 2016-02-24 ENCOUNTER — Emergency Department (HOSPITAL_COMMUNITY): Payer: BLUE CROSS/BLUE SHIELD | Admitting: Certified Registered Nurse Anesthetist

## 2016-02-24 ENCOUNTER — Encounter (HOSPITAL_COMMUNITY): Payer: Self-pay | Admitting: Urology

## 2016-02-24 DIAGNOSIS — Z8249 Family history of ischemic heart disease and other diseases of the circulatory system: Secondary | ICD-10-CM | POA: Diagnosis not present

## 2016-02-24 DIAGNOSIS — N179 Acute kidney failure, unspecified: Secondary | ICD-10-CM | POA: Diagnosis present

## 2016-02-24 DIAGNOSIS — Z87442 Personal history of urinary calculi: Secondary | ICD-10-CM | POA: Diagnosis not present

## 2016-02-24 DIAGNOSIS — R112 Nausea with vomiting, unspecified: Secondary | ICD-10-CM | POA: Diagnosis not present

## 2016-02-24 DIAGNOSIS — R509 Fever, unspecified: Secondary | ICD-10-CM | POA: Diagnosis not present

## 2016-02-24 DIAGNOSIS — Z880 Allergy status to penicillin: Secondary | ICD-10-CM | POA: Diagnosis not present

## 2016-02-24 DIAGNOSIS — N132 Hydronephrosis with renal and ureteral calculous obstruction: Secondary | ICD-10-CM | POA: Diagnosis present

## 2016-02-24 DIAGNOSIS — N201 Calculus of ureter: Secondary | ICD-10-CM | POA: Diagnosis not present

## 2016-02-24 DIAGNOSIS — N10 Acute tubulo-interstitial nephritis: Secondary | ICD-10-CM | POA: Diagnosis not present

## 2016-02-24 DIAGNOSIS — Z833 Family history of diabetes mellitus: Secondary | ICD-10-CM | POA: Diagnosis not present

## 2016-02-24 DIAGNOSIS — R1032 Left lower quadrant pain: Secondary | ICD-10-CM | POA: Diagnosis not present

## 2016-02-24 DIAGNOSIS — R51 Headache: Secondary | ICD-10-CM | POA: Diagnosis not present

## 2016-02-24 DIAGNOSIS — Z6839 Body mass index (BMI) 39.0-39.9, adult: Secondary | ICD-10-CM | POA: Diagnosis not present

## 2016-02-24 HISTORY — DX: Hydronephrosis with renal and ureteral calculous obstruction: N13.2

## 2016-02-24 LAB — BASIC METABOLIC PANEL
Anion gap: 7 (ref 5–15)
BUN: 14 mg/dL (ref 6–20)
CALCIUM: 7.6 mg/dL — AB (ref 8.9–10.3)
CO2: 21 mmol/L — ABNORMAL LOW (ref 22–32)
CREATININE: 1.3 mg/dL — AB (ref 0.44–1.00)
Chloride: 103 mmol/L (ref 101–111)
GFR calc Af Amer: 56 mL/min — ABNORMAL LOW (ref >60)
GFR, EST NON AFRICAN AMERICAN: 48 mL/min — AB (ref >60)
GLUCOSE: 175 mg/dL — AB (ref 65–99)
Potassium: 3.4 mmol/L — ABNORMAL LOW (ref 3.5–5.1)
SODIUM: 131 mmol/L — AB (ref 135–145)

## 2016-02-24 LAB — CBC
HCT: 27.1 % — ABNORMAL LOW (ref 36.0–46.0)
Hemoglobin: 8.8 g/dL — ABNORMAL LOW (ref 12.0–15.0)
MCH: 26 pg (ref 26.0–34.0)
MCHC: 32.5 g/dL (ref 30.0–36.0)
MCV: 80.2 fL (ref 78.0–100.0)
PLATELETS: 170 10*3/uL (ref 150–400)
RBC: 3.38 MIL/uL — AB (ref 3.87–5.11)
RDW: 16 % — AB (ref 11.5–15.5)
WBC: 14.1 10*3/uL — ABNORMAL HIGH (ref 4.0–10.5)

## 2016-02-24 MED ORDER — KCL IN DEXTROSE-NACL 20-5-0.45 MEQ/L-%-% IV SOLN
INTRAVENOUS | Status: DC
  Administered 2016-02-24 – 2016-02-25 (×4): via INTRAVENOUS
  Filled 2016-02-24 (×4): qty 1000

## 2016-02-24 MED ORDER — HYDROCODONE-ACETAMINOPHEN 5-325 MG PO TABS
1.0000 | ORAL_TABLET | ORAL | Status: DC | PRN
  Administered 2016-02-24: 1 via ORAL
  Administered 2016-02-24 – 2016-02-25 (×2): 2 via ORAL
  Filled 2016-02-24: qty 2
  Filled 2016-02-24: qty 1
  Filled 2016-02-24: qty 2

## 2016-02-24 MED ORDER — LIDOCAINE 2% (20 MG/ML) 5 ML SYRINGE
INTRAMUSCULAR | Status: DC | PRN
  Administered 2016-02-24: 50 mg via INTRAVENOUS

## 2016-02-24 MED ORDER — DOCUSATE SODIUM 100 MG PO CAPS
100.0000 mg | ORAL_CAPSULE | Freq: Two times a day (BID) | ORAL | Status: DC
  Administered 2016-02-24 – 2016-02-26 (×5): 100 mg via ORAL
  Filled 2016-02-24 (×6): qty 1

## 2016-02-24 MED ORDER — DIPHENHYDRAMINE HCL 12.5 MG/5ML PO ELIX: 12.5000 mg | ORAL_SOLUTION | Freq: Four times a day (QID) | ORAL | Status: DC | PRN

## 2016-02-24 MED ORDER — PANTOPRAZOLE SODIUM 40 MG PO TBEC
40.0000 mg | DELAYED_RELEASE_TABLET | Freq: Every day | ORAL | Status: DC
  Administered 2016-02-24 – 2016-02-26 (×3): 40 mg via ORAL
  Filled 2016-02-24 (×3): qty 1

## 2016-02-24 MED ORDER — PROPOFOL 10 MG/ML IV BOLUS
INTRAVENOUS | Status: DC | PRN
  Administered 2016-02-24: 200 mg via INTRAVENOUS

## 2016-02-24 MED ORDER — MEPERIDINE HCL 50 MG/ML IJ SOLN: 6.2500 mg | INTRAMUSCULAR | Status: DC | PRN

## 2016-02-24 MED ORDER — CEFTRIAXONE SODIUM 1 G IJ SOLR
1.0000 g | INTRAMUSCULAR | Status: DC
  Administered 2016-02-24: 1 g via INTRAVENOUS
  Filled 2016-02-24 (×2): qty 10

## 2016-02-24 MED ORDER — DIPHENHYDRAMINE HCL 50 MG/ML IJ SOLN: 12.5000 mg | Freq: Four times a day (QID) | INTRAMUSCULAR | Status: DC | PRN

## 2016-02-24 MED ORDER — PROMETHAZINE HCL 25 MG/ML IJ SOLN: 6.2500 mg | INTRAMUSCULAR | Status: DC | PRN

## 2016-02-24 MED ORDER — ONDANSETRON HCL 4 MG/2ML IJ SOLN
4.0000 mg | INTRAMUSCULAR | Status: DC | PRN
  Administered 2016-02-25 (×2): 4 mg via INTRAVENOUS
  Filled 2016-02-24 (×3): qty 2

## 2016-02-24 MED ORDER — STERILE WATER FOR IRRIGATION IR SOLN
Status: DC | PRN
  Administered 2016-02-24: 3000 mL

## 2016-02-24 MED ORDER — ACETAMINOPHEN 325 MG PO TABS
650.0000 mg | ORAL_TABLET | ORAL | Status: DC | PRN
  Administered 2016-02-24 – 2016-02-25 (×3): 650 mg via ORAL
  Filled 2016-02-24 (×3): qty 2

## 2016-02-24 MED ORDER — HYDROMORPHONE HCL 1 MG/ML IJ SOLN: 0.2500 mg | INTRAMUSCULAR | Status: DC | PRN

## 2016-02-24 MED ORDER — MIDAZOLAM HCL 5 MG/5ML IJ SOLN
INTRAMUSCULAR | Status: DC | PRN
  Administered 2016-02-24: 2 mg via INTRAVENOUS

## 2016-02-24 MED ORDER — LACTATED RINGERS IV SOLN
INTRAVENOUS | Status: DC | PRN
  Administered 2016-02-24: 01:00:00 via INTRAVENOUS

## 2016-02-24 MED ORDER — MIDAZOLAM HCL 2 MG/2ML IJ SOLN: 0.5000 mg | Freq: Once | INTRAMUSCULAR | Status: DC | PRN

## 2016-02-24 NOTE — Progress Notes (Deleted)
Went over all discharge paperwork with patient.  All questions answered.  Paperwork given to patient.  Pt discharge via wheelchair with portable o2 delivered from [facility].

## 2016-02-24 NOTE — Progress Notes (Signed)
Patient ID: Samantha BackersSandra J Simpson, female   DOB: 17-Jan-1971, 45 y.o.   MRN: [id number]  1 Day Post-Op Subjective: Pt still somnolent.  Pain improved in flank except when voiding.  No fever this morning.  Complains of headache.  Objective: Vital signs in last 24 hours: Temp:  [98.1 F (36.7 C)-103.1 F (39.5 C)] 99.7 F (37.6 C) (11/16 1308) Pulse Rate:  [71-125] 90 (11/16 1308) Resp:  [15-30] 28 (11/16 1308) BP: (94-174)/(65-115) 151/83 (11/16 1308) SpO2:  [95 %-100 %] 95 % (11/16 1308) Weight:  [108.9 kg (240 lb)] 108.9 kg (240 lb) (11/15 2142)  Intake/Output from previous day: 11/15 0701 - 11/16 0700 In: 1827.1 [I.V.:777.1; IV Piggyback:1050] Out: 200 [Urine:200] Intake/Output this shift: Total I/O In: 120 [P.O.:120] Out: 250 [Urine:250]  Physical Exam:  General: Alert and oriented   Lab Results:  Recent Labs  02/21/16 2155 02/23/16 2030 02/24/16 0319  HGB 10.4* 11.0* 8.8*  HCT 33.5* 33.7* 27.1*   CBC Latest Ref Rng & Units 02/24/2016 02/23/2016 02/21/2016  WBC 4.0 - 10.5 K/uL 14.1(H) 8.8 10.7(H)  Hemoglobin 12.0 - 15.0 g/dL 4.0(J8.8(L) 11.0(L) 10.4(L)  Hematocrit 36.0 - 46.0 % 27.1(L) 33.7(L) 33.5(L)  Platelets 150 - 400 K/uL 170 197 455(H)     BMET  Recent Labs  02/23/16 2030 02/24/16 0319  NA 131* 131*  K 3.7 3.4*  CL 100* 103  CO2 19* 21*  GLUCOSE 172* 175*  BUN 13 14  CREATININE 1.74* 1.30*  CALCIUM 8.5* 7.6*     Studies/Results:   Assessment/Plan: Left ureteral stone / pyelonephritis - Continue ceftriaxone, culture pending - Continue IVF until po intake improved - Will need culture specific antibiotics for 2 weeks followed by repeat ureteroscopy and stone removal   LOS: 0 days   Quentina Fronek,LES 02/24/2016, 1:14 PM

## 2016-02-24 NOTE — Op Note (Signed)
  Preoperative diagnosis:  1. Left ureteral stone 2. Fever   Postoperative diagnosis:  1. Left ureteral stone 2. Fever   Procedure:  1. Cystoscopy 2. Left ureteral stent placement (6 x 24) - no string 3. Left ureteral catheterization with culture  Surgeon: Moody BruinsLester S. Nevaeha Finerty, Jr. M.D.  Anesthesia: General  Complications: None  EBL: Minimal  Specimens: Left renal pelvic urine culture  Indication: Samantha Simpson is a 45 y.o. patient with a 3 mm left ureteral stone and fever to 103. After reviewing the management options for treatment, he elected to proceed with the above surgical procedure(s). We have discussed the potential benefits and risks of the procedure, side effects of the proposed treatment, the likelihood of the patient achieving the goals of the procedure, and any potential problems that might occur during the procedure or recuperation. Informed consent has been obtained.  Description of procedure:  The patient was taken to the operating room and general anesthesia was induced.  The patient was placed in the dorsal lithotomy position, prepped and draped in the usual sterile fashion, and preoperative antibiotics were administered. A preoperative time-out was performed.   Cystourethroscopy was performed.  The patient's urethra was examined and was normal. The bladder was then systematically examined in its entirety. There was no evidence for any bladder tumors, stones, or other mucosal pathology.    A 0.38 sensor guidewire was then advanced up the left ureter into the renal pelvis under fluoroscopic guidance.  A 6 Fr ureteral catheter was then advanced over the wire into the renal pelvis and a urine culture was obtained. The wire was then replaced. The wire was then backloaded through the cystoscope and a ureteral stent was advance over the wire using Seldinger technique.  The stent was positioned appropriately under fluoroscopic and cystoscopic guidance.  The wire was then  removed with an adequate stent curl noted in the renal pelvis as well as in the bladder.  The bladder was then emptied and the procedure ended.  The patient appeared to tolerate the procedure well and without complications.  The patient was able to be awakened and transferred to the recovery unit in satisfactory condition.    Moody BruinsLester S. Wilborn Membreno, Jr. MD

## 2016-02-24 NOTE — Transfer of Care (Signed)
Immediate Anesthesia Transfer of Care Note  Patient: [name]  Procedure(s) Performed: Procedure(s): CYSTOSCOPY WITH STENT PLACEMENT (Left)  Patient Location: PACU  Anesthesia Type:General  Level of Consciousness: awake, alert  and oriented  Airway & Oxygen Therapy: Patient Spontanous Breathing and Patient connected to face mask oxygen  Post-op Assessment: Report given to RN and Post -op Vital signs reviewed and stable  Post vital signs: Reviewed and stable  Last Vitals:  Vitals:   02/23/16 2300 02/23/16 2330  BP: 137/76 135/78  Pulse: 87 86  Resp: 19 24  Temp:      Last Pain:  Vitals:   02/24/16 0005  TempSrc:   PainSc: 3          Complications: No apparent anesthesia complications

## 2016-02-24 NOTE — Care Management Note (Signed)
Case Management Note  Patient Details  Name: Samantha Simpson MRN: [id number] Date of Birth: [birth year]  Subjective/Objective: 45 y/o f admitted w/L ureteral stone. S/p cystourethroscopy. From home.                   Action/Plan:d/c home.   Expected Discharge Date:                  Expected Discharge Plan:  Home/Self Care  In-House Referral:     Discharge planning Services  CM Consult  Post Acute Care Choice:    Choice offered to:     DME Arranged:    DME Agency:     HH Arranged:    HH Agency:     Status of Service:  In process, will continue to follow  If discussed at Long Length of Stay Meetings, dates discussed:    Additional Comments:  Lanier ClamMahabir, Tana Trefry, RN 02/24/2016, 2:02 PM

## 2016-02-24 NOTE — Anesthesia Postprocedure Evaluation (Signed)
Anesthesia Post Note  Patient: Samantha Simpson  Procedure(s) Performed: Procedure(s) (LRB): CYSTOSCOPY WITH STENT PLACEMENT (Left)  Patient location during evaluation: PACU Anesthesia Type: General Level of consciousness: oriented, patient cooperative and sedated Pain management: pain level controlled Vital Signs Assessment: post-procedure vital signs reviewed and stable Respiratory status: spontaneous breathing, nonlabored ventilation and respiratory function stable Cardiovascular status: blood pressure returned to baseline and stable Postop Assessment: no signs of nausea or vomiting Anesthetic complications: no    Last Vitals:  Vitals:   02/24/16 0130 02/24/16 0145  BP: 102/67 115/71  Pulse: 77 71  Resp: 17 15  Temp:      Last Pain:  Vitals:   02/24/16 0005  TempSrc:   PainSc: 3                  Aislin Onofre,E. Ilissa Rosner

## 2016-02-24 NOTE — Anesthesia Preprocedure Evaluation (Addendum)
Anesthesia Evaluation  Patient identified by MRN, date of birth, ID band Patient awake    Reviewed: Allergy & Precautions, NPO status , Patient's Chart, lab work & pertinent test results  History of Anesthesia Complications Negative for: history of anesthetic complications  Airway Mallampati: II  TM Distance: >3 FB Neck ROM: Full    Dental  (+) Dental Advisory Given   Pulmonary neg pulmonary ROS,    breath sounds clear to auscultation       Cardiovascular negative cardio ROS   Rhythm:Regular Rate:Normal     Neuro/Psych negative neurological ROS     GI/Hepatic negative GI ROS, Neg liver ROS,   Endo/Other  Morbid obesity  Renal/GU Renal InsufficiencyRenal disease (creat 1.74)Ureteral stone     Musculoskeletal   Abdominal (+) + obese,   Peds  Hematology negative hematology ROS (+)   Anesthesia Other Findings   Reproductive/Obstetrics                            Anesthesia Physical Anesthesia Plan  ASA: II and emergent  Anesthesia Plan: General   Post-op Pain Management:    Induction: Intravenous  Airway Management Planned: LMA  Additional Equipment:   Intra-op Plan:   Post-operative Plan:   Informed Consent: I have reviewed the patients History and Physical, chart, labs and discussed the procedure including the risks, benefits and alternatives for the proposed anesthesia with the patient or authorized representative who has indicated his/her understanding and acceptance.   Dental advisory given  Plan Discussed with: CRNA and Surgeon  Anesthesia Plan Comments: (Plan routine monitors GA- LMA OK)        Anesthesia Quick Evaluation

## 2016-02-24 NOTE — Anesthesia Procedure Notes (Signed)
Procedure Name: LMA Insertion Performed by: Dalen Hennessee J Pre-anesthesia Checklist: Patient identified, Emergency Drugs available, Suction available, Patient being monitored and Timeout performed Patient Re-evaluated:Patient Re-evaluated prior to inductionOxygen Delivery Method: Circle system utilized Preoxygenation: Pre-oxygenation with 100% oxygen Intubation Type: IV induction Ventilation: Mask ventilation without difficulty LMA: LMA inserted LMA Size: 4.0 Number of attempts: 1 Placement Confirmation: positive ETCO2,  CO2 detector and breath sounds checked- equal and bilateral Tube secured with: Tape Dental Injury: Teeth and Oropharynx as per pre-operative assessment        

## 2016-02-25 ENCOUNTER — Encounter (HOSPITAL_COMMUNITY): Payer: Self-pay

## 2016-02-25 LAB — CBC
HEMATOCRIT: 28.2 % — AB (ref 36.0–46.0)
HEMOGLOBIN: 9.1 g/dL — AB (ref 12.0–15.0)
MCH: 26.3 pg (ref 26.0–34.0)
MCHC: 32.3 g/dL (ref 30.0–36.0)
MCV: 81.5 fL (ref 78.0–100.0)
Platelets: 163 10*3/uL (ref 150–400)
RBC: 3.46 MIL/uL — AB (ref 3.87–5.11)
RDW: 16.1 % — ABNORMAL HIGH (ref 11.5–15.5)
WBC: 9.1 10*3/uL (ref 4.0–10.5)

## 2016-02-25 LAB — BASIC METABOLIC PANEL
ANION GAP: 4 — AB (ref 5–15)
BUN: 7 mg/dL (ref 6–20)
CHLORIDE: 106 mmol/L (ref 101–111)
CO2: 24 mmol/L (ref 22–32)
Calcium: 8.1 mg/dL — ABNORMAL LOW (ref 8.9–10.3)
Creatinine, Ser: 0.62 mg/dL (ref 0.44–1.00)
GFR calc non Af Amer: >60 mL/min (ref >60)
Glucose, Bld: 191 mg/dL — ABNORMAL HIGH (ref 65–99)
POTASSIUM: 3.7 mmol/L (ref 3.5–5.1)
SODIUM: 134 mmol/L — AB (ref 135–145)

## 2016-02-25 LAB — URINE CULTURE: Culture: NO GROWTH

## 2016-02-25 MED ORDER — CIPROFLOXACIN HCL 500 MG PO TABS: 500.0000 mg | ORAL_TABLET | Freq: Two times a day (BID) | ORAL | 0 refills | Status: DC

## 2016-02-25 MED ORDER — KETOROLAC TROMETHAMINE 15 MG/ML IJ SOLN
15.0000 mg | Freq: Four times a day (QID) | INTRAMUSCULAR | Status: DC | PRN
  Administered 2016-02-25: 15 mg via INTRAVENOUS
  Filled 2016-02-25: qty 1

## 2016-02-25 MED ORDER — CIPROFLOXACIN HCL 500 MG PO TABS
500.0000 mg | ORAL_TABLET | Freq: Two times a day (BID) | ORAL | Status: DC
  Administered 2016-02-25 – 2016-02-26 (×2): 500 mg via ORAL
  Filled 2016-02-25 (×2): qty 1

## 2016-02-25 MED ORDER — IBUPROFEN 200 MG PO TABS
600.0000 mg | ORAL_TABLET | Freq: Four times a day (QID) | ORAL | Status: DC | PRN
  Administered 2016-02-25: 600 mg via ORAL
  Filled 2016-02-25: qty 3

## 2016-02-25 MED ORDER — HYDROCODONE-ACETAMINOPHEN 5-325 MG PO TABS: 1.0000 | ORAL_TABLET | Freq: Four times a day (QID) | ORAL | 0 refills | Status: DC | PRN

## 2016-02-25 NOTE — Progress Notes (Signed)
Patient ID: Samantha BackersSandra J Simpson, female   DOB: 09/03/1970, 45 y.o.   MRN: [id number]  2 Days Post-Op Subjective: Pt with decreased flank pain except when voiding consistent with her indwelling stent.  Fever curve improving with temp to 101.5 last night.  She complains of a persistent right temporal headache that has been severe and constant for the last 24 hours.  She has had relief with pain medication (acetaminophen and hydrocodone) but she only gets temporary relief until her medication wears off.  She denies any change in vision or any other neurologic symptoms or auras.  She denies excessive caffeine use at baseline.  She has no history of migraines or other headaches.  Objective: Vital signs in last 24 hours: Temp:  [98 F (36.7 C)-101.4 F (38.6 C)] 98 F (36.7 C) (11/17 0550) Pulse Rate:  [81-92] 81 (11/17 0550) Resp:  [18-28] 18 (11/17 0550) BP: (128-151)/(83-94) 151/86 (11/17 0550) SpO2:  [95 %-100 %] 98 % (11/17 0550)  Intake/Output from previous day: 11/16 0701 - 11/17 0700 In: 3170 [P.O.:120; I.V.:3000; IV Piggyback:50] Out: 1000 [Urine:1000] Intake/Output this shift: No intake/output data recorded.  Physical Exam:  General: Alert and oriented CV: RRR Lungs: Normal respiratory effort Abdomen: Soft, ND, No CVAT Neuro: No focal deficits noted.   Lab Results:  Recent Labs  02/23/16 2030 02/24/16 0319  HGB 11.0* 8.8*  HCT 33.7* 27.1*   CBC Latest Ref Rng & Units 02/24/2016 02/23/2016 02/21/2016  WBC 4.0 - 10.5 K/uL 14.1(H) 8.8 10.7(H)  Hemoglobin 12.0 - 15.0 g/dL 4.0(J8.8(L) 11.0(L) 10.4(L)  Hematocrit 36.0 - 46.0 % 27.1(L) 33.7(L) 33.5(L)  Platelets 150 - 400 K/uL 170 197 455(H)     BMET  Recent Labs  02/23/16 2030 02/24/16 0319  NA 131* 131*  K 3.7 3.4*  CL 100* 103  CO2 19* 21*  GLUCOSE 172* 175*  BUN 13 14  CREATININE 1.74* 1.30*  CALCIUM 8.5* 7.6*     Studies/Results: Urine culture pending.  Assessment/Plan: 1. Left ureteral  stone/pyelonephritis s/p stent:  I reassured her that her pain symptoms are consistent with her indwelling stent.  Her flank pain is controlled.  Her urine culture is pending.  Will await final result to determine antibiotic therapy to continue as outpatient.  She will continue ceftriaxone in the meantime. It is possible that she will require empiric therapy as no culture was obtained when she initially presented in the ED on Monday and she had been on antibiotics for a few days before her culture.  Will D/C telemetry today.  She may be ready for d/c home later today.  2. Right temporal headache:  This could be tension headache (although unusual it is unilateral) vs migraine (but no history).  Her pain has been persistent, constant, and severe.  I will ask Hospitalist service to evaluate and provide input.  I do not suspect serious underlying neurologic problem considering lack of symptoms and her otherwise good overall health.  Will try NSAIDs to see if improved pain response.   LOS: 1 day   Samantha Simpson,LES 02/25/2016, 7:44 AM

## 2016-02-25 NOTE — Discharge Instructions (Addendum)
1. You may see some blood in the urine and may have some burning with urination for 48-72 hours. You also may notice that you have to urinate more frequently or urgently after your procedure which is normal.  2. You should call should you develop an inability urinate, fever > 101, persistent nausea and vomiting that prevents you from eating or drinking to stay hydrated.  3. If you have a stent, you will likely urinate more frequently and urgently until the stent is removed and you may experience some discomfort/pain in the lower abdomen and flank especially when urinating. You may take pain medication prescribed to you if needed for pain. You may also intermittently have blood in the urine until the stent is removed.   Please call neurologist next week for an appointment if your headache is not improving.

## 2016-02-25 NOTE — Progress Notes (Signed)
Patient ID: Samantha BackersSandra J Simpson, female   DOB: Jan 16, 1971, 45 y.o.   MRN: [id number]  Pt doing ok but still with headache.  Continues to mainly be in right temporal area with some radiation to back of head.  Tylenol has been helpful but it returns when medication wears off.  She has had fairly poor po intake today.    I spoke with IM today and they suggested neurology evaluation either as an outpatient (they provided me with a name for f/u) or as inpatient if neurologic symptoms develop or headache remains severe and not controlled.   Plan:  Will alternate ibuprofen and acetaminophen.  She can hopefully go home and continue these medications over the weekend with plans to schedule outpatient follow up if her headache persists into next week.  She will work on improving her po intake.  Will keep IVF locked for now.  Urine culture shows no growth.  Will change to Cipro (she was on Bactrim before she came in febrile) and will continue for 2 weeks.  Hope for discharge in the morning if doing better and I will arrange outpatient follow up.

## 2016-02-25 NOTE — Progress Notes (Signed)
Patient had one episode of emesis (moderate amount of orange/yellow fluid).  Zofran given.  Will continue to monitor closely.

## 2016-02-25 NOTE — Discharge Summary (Signed)
  Date of admission: 02/23/2016  Date of discharge: 02/26/2016  Admission diagnosis: Left ureteral stone, pyelonephritis  Discharge diagnosis: Left ureteral stone, pyelonephritis, headache  Secondary diagnoses: None  History and Physical: For full details, please see admission history and physical. Briefly, Samantha Simpson is a 45 y.o. year old patient with a left ureteral stone and febrile left renal infection.   Hospital Course: She was seen in the ED on 02/23/16 and was noted to be febrile to 103 with a 3 mm left ureteral stone.  She underwent urgent stent placement that night.  She was kept on ceftriaxone and she clinically improved over the next 48 hrs.  Her urine culture was negative and no culture had been obtained in the ED on Monday when she had been started on antibiotics.  She had a severe right temporal headache during her hospitalization that was managed with acetaminophen and ibuprofen.  She had no underlying neurologic symptoms or problems.  On 02/26/16, her headache was completely resolved after she had caffeine.  She was able to tolerate her diet and she was discharged home on ciprofloxacin empirically.  Laboratory values:  Recent Labs  02/23/16 2030 02/24/16 0319 02/25/16 0755  HGB 11.0* 8.8* 9.1*  HCT 33.7* 27.1* 28.2*   . CBC Latest Ref Rng & Units 02/25/2016 02/24/2016 02/23/2016  WBC 4.0 - 10.5 K/uL 9.1 14.1(H) 8.8  Hemoglobin 12.0 - 15.0 g/dL 1.1(B9.1(L) 1.4(N8.8(L) 11.0(L)  Hematocrit 36.0 - 46.0 % 28.2(L) 27.1(L) 33.7(L)  Platelets 150 - 400 K/uL 163 170 197      Recent Labs  02/24/16 0319 02/25/16 0755  CREATININE 1.30* 0.62    Disposition: Home  Discharge medications:    Medication List    STOP taking these medications   sulfamethoxazole-trimethoprim 800-160 MG tablet Commonly known as:  BACTRIM DS,SEPTRA DS     TAKE these medications   ciprofloxacin 500 MG tablet Commonly known as:  CIPRO Take 1 tablet (500 mg total) by mouth 2 (two) times  daily.   HYDROcodone-acetaminophen 5-325 MG tablet Commonly known as:  NORCO/VICODIN Take 1-2 tablets by mouth every 6 (six) hours as needed. What changed:  when to take this   ondansetron 4 MG tablet Commonly known as:  ZOFRAN Take 1 tablet (4 mg total) by mouth every 6 (six) hours.       Followup: Will call to schedule.

## 2016-02-26 NOTE — Progress Notes (Signed)
3 Days Post-Op  Assessment: 1.  Status post left ureteral stent placement for obstructing stone and fever - She remains afebrile with no significant flank pain or voiding symptoms.  She will remain on antibiotics upon discharge.  2.  Right temporal headache - although she was questioned about the use of caffeine and the lack thereof during her hospitalization as a possible cause for her headache she did not indicate this was a likely problem.  When she drank a caffeinated beverage her headache resolved and she remained headache free now.  Plan: She will be discharged home with oral antibiotics to follow up as an outpatient for definitive therapy of her right ureteral calculus.   Subjective: Patient is without complaint. Specifically her headache has resolved.  Objective: Vital signs in last 24 hours: Temp:  [98.6 F (37 C)-99.3 F (37.4 C)] 98.6 F (37 C) (11/18 0424) Pulse Rate:  [77-80] 77 (11/18 0424) Resp:  [16-20] 16 (11/18 0424) BP: (151-158)/(82-90) 153/90 (11/18 0424) SpO2:  [97 %-99 %] 98 % (11/18 0424)  Intake/Output from previous day: 11/17 0701 - 11/18 0700 In: 680 [P.O.:680] Out: 2075 [Urine:2075] Intake/Output this shift: No intake/output data recorded.  Past Medical History:  Diagnosis Date  . ASCUS (atypical squamous cells of undetermined significance) on Pap smear 1997  . BV (bacterial vaginosis) 2001  . CIN I (cervical intraepithelial neoplasia I) 2004  . Exposure to chlamydia, mucopurulent cervitis/nongonococcal urethritis 1997  . H/O fatigue 2010  . H/O: obesity 1997  . HGSIL (high grade squamous intraepithelial dysplasia)   . Increased BMI 2006  . Ovarian cyst, left 1996  . Renal stones    Current Facility-Administered Medications  Medication Dose Route Frequency Provider Last Rate Last Dose  . acetaminophen (TYLENOL) tablet 650 mg  650 mg Oral Q4H PRN Heloise PurpuraLester Borden, MD   650 mg at 02/25/16 1112  . ciprofloxacin (CIPRO) tablet 500 mg  500 mg Oral BID  Heloise PurpuraLester Borden, MD   500 mg at 02/25/16 2048  . diphenhydrAMINE (BENADRYL) injection 12.5 mg  12.5 mg Intravenous Q6H PRN Heloise PurpuraLester Borden, MD       Or  . diphenhydrAMINE (BENADRYL) 12.5 MG/5ML elixir 12.5 mg  12.5 mg Oral Q6H PRN Heloise PurpuraLester Borden, MD      . docusate sodium (COLACE) capsule 100 mg  100 mg Oral BID Heloise PurpuraLester Borden, MD   100 mg at 02/25/16 2048  . HYDROcodone-acetaminophen (NORCO/VICODIN) 5-325 MG per tablet 1-2 tablet  1-2 tablet Oral Q4H PRN Heloise PurpuraLester Borden, MD   2 tablet at 02/25/16 0550  . ibuprofen (ADVIL,MOTRIN) tablet 600 mg  600 mg Oral Q6H PRN Heloise PurpuraLester Borden, MD   600 mg at 02/25/16 1822  . ondansetron (ZOFRAN) injection 4 mg  4 mg Intravenous Q4H PRN Heloise PurpuraLester Borden, MD   4 mg at 02/25/16 1140  . pantoprazole (PROTONIX) EC tablet 40 mg  40 mg Oral Daily Heloise PurpuraLester Borden, MD   40 mg at 02/25/16 1107    Physical Exam:  General: Patient is in no apparent distress Lungs: Normal respiratory effort, chest expands symmetrically. GI: The abdomen is soft and nontender without mass.    Lab Results:  Recent Labs  02/23/16 2030 02/24/16 0319 02/25/16 0755  WBC 8.8 14.1* 9.1  HGB 11.0* 8.8* 9.1*  HCT 33.7* 27.1* 28.2*   BMET  Recent Labs  02/24/16 0319 02/25/16 0755  NA 131* 134*  K 3.4* 3.7  CL 103 106  CO2 21* 24  GLUCOSE 175* 191*  BUN 14 7  CREATININE 1.30* 0.62  CALCIUM 7.6* 8.1*   No results for input(s): LABPT, INR in the last 72 hours. No results for input(s): LABURIN in the last 72 hours. Results for orders placed or performed during the hospital encounter of 02/23/16  Urine culture     Status: None   Collection Time: 02/24/16 12:55 AM  Result Value Ref Range Status   Specimen Description CYSTOSCOPY LEFT KIDNEY  Final   Special Requests PATIENT ON FOLLOWING ROCEPHIN  Final   Culture NO GROWTH [facility]   Final   Report Status 02/25/2016 FINAL  Final    Studies/Results: No results found.     Delbra Zellars C 02/26/2016, 7:23  AM

## 2016-02-28 LAB — CULTURE, BLOOD (ROUTINE X 2)
Culture: NO GROWTH
Culture: NO GROWTH

## 2016-02-29 ENCOUNTER — Other Ambulatory Visit: Payer: Self-pay | Admitting: Urology

## 2016-03-01 ENCOUNTER — Encounter (HOSPITAL_COMMUNITY)
Admission: RE | Admit: 2016-03-01 | Discharge: 2016-03-01 | Disposition: A | Discharge status: Home / Self Care | Payer: BLUE CROSS/BLUE SHIELD | Source: Ambulatory Visit | Attending: Urology | Admitting: Urology

## 2016-03-01 ENCOUNTER — Encounter (HOSPITAL_COMMUNITY): Payer: Self-pay

## 2016-03-01 DIAGNOSIS — N201 Calculus of ureter: Secondary | ICD-10-CM | POA: Insufficient documentation

## 2016-03-01 DIAGNOSIS — Z01812 Encounter for preprocedural laboratory examination: Secondary | ICD-10-CM | POA: Diagnosis not present

## 2016-03-01 LAB — CBC
HCT: 31.7 % — ABNORMAL LOW (ref 36.0–46.0)
HEMOGLOBIN: 10 g/dL — AB (ref 12.0–15.0)
MCH: 26 pg (ref 26.0–34.0)
MCHC: 31.5 g/dL (ref 30.0–36.0)
MCV: 82.3 fL (ref 78.0–100.0)
Platelets: 460 10*3/uL — ABNORMAL HIGH (ref 150–400)
RBC: 3.85 MIL/uL — AB (ref 3.87–5.11)
RDW: 16.3 % — ABNORMAL HIGH (ref 11.5–15.5)
WBC: 8 10*3/uL (ref 4.0–10.5)

## 2016-03-01 LAB — BASIC METABOLIC PANEL
ANION GAP: 8 (ref 5–15)
BUN: 9 mg/dL (ref 6–20)
CALCIUM: 8.9 mg/dL (ref 8.9–10.3)
CO2: 25 mmol/L (ref 22–32)
Chloride: 105 mmol/L (ref 101–111)
Creatinine, Ser: 0.74 mg/dL (ref 0.44–1.00)
Glucose, Bld: 119 mg/dL — ABNORMAL HIGH (ref 65–99)
Potassium: 3.4 mmol/L — ABNORMAL LOW (ref 3.5–5.1)
Sodium: 138 mmol/L (ref 135–145)

## 2016-03-01 LAB — HCG, SERUM, QUALITATIVE: PREG SERUM: NEGATIVE

## 2016-03-01 NOTE — Patient Instructions (Addendum)
Samantha BackersSandra J Simpson  03/01/2016   Your procedure is scheduled on: 03/13/16  [facility]  Entrance take [facility]  elevators to 3rd floor to  Short Stay Center at  2:45 PM  Call this number if you have problems the morning of surgery [phone]   Remember: ONLY 1 PERSON MAY GO WITH YOU TO SHORT STAY TO GET  READY MORNING OF YOUR SURGERY.    Do not eat food  :After Midnight.----MAY HAVE CLEAR LIQUIDS DAY OF SURGERY UNTIL 10:30 AM---THEN NOTHING     Take these medicines the morning of surgery with A SIP OF WATER: No regular meds,  May take Hydrocodone, Ondansetron if needed                                   You may not have any metal on your body including hair pins and              piercings  Do not wear jewelry, make-up, lotions, powders or perfumes, deodorant             Do not wear nail polish.  Do not shave  48 hours prior to surgery.              Men may shave face and neck.   Do not bring valuables to the hospital. [facility] IS NOT             RESPONSIBLE   FOR VALUABLES.  Contacts, dentures or bridgework may not be worn into surgery.  Leave suitcase in the car. After surgery it may be brought to your room.     Patients discharged the day of surgery will not be allowed to drive home.  Name and phone number of your driver:  Special Instructions: N/A              Please read over the following fact sheets you were given: _____________________________________________________________________             [facility] - Preparing for [facility] Before surgery, you can play an important role.  Because skin is not sterile, your skin needs to be as free of germs as possible.  You can reduce the number of germs on your skin by washing with CHG (chlorahexidine gluconate) soap before surgery.  CHG is an antiseptic cleaner which kills germs and bonds with the skin to continue killing germs even after washing. Please DO NOT use if you have an allergy to  CHG or antibacterial soaps.  If your skin becomes reddened/irritated stop using the CHG and inform your nurse when you arrive at Short Stay. Do not shave (including legs and underarms) for at least 48 hours prior to the first CHG shower.  You may shave your face/neck. Please follow these instructions carefully:  1.  Shower with CHG Soap the night before surgery and the  morning of Surgery.  2.  If you choose to wash your hair, wash your hair first as usual with your  normal  shampoo.  3.  After you shampoo, rinse your hair and body thoroughly to remove the  shampoo.                           4.  Use CHG as you would any other liquid soap.  You can apply chg directly  to the skin and wash                       Gently with a scrungie or clean washcloth.  5.  Apply the CHG Soap to your body ONLY FROM THE NECK DOWN.   Do not use on face/ open                           Wound or open sores. Avoid contact with eyes, ears mouth and genitals (private parts).                       Wash face,  Genitals (private parts) with your normal soap.             6.  Wash thoroughly, paying special attention to the area where your surgery  will be performed.  7.  Thoroughly rinse your body with warm water from the neck down.  8.  DO NOT shower/wash with your normal soap after using and rinsing off  the CHG Soap.                9.  Pat yourself dry with a clean towel.            10.  Wear clean pajamas.            11.  Place clean sheets on your bed the night of your first shower and do not  sleep with pets. Day of Surgery : Do not apply any lotions/deodorants the morning of surgery.  Please wear clean clothes to the hospital/surgery center.  FAILURE TO FOLLOW THESE INSTRUCTIONS MAY RESULT IN THE CANCELLATION OF YOUR SURGERY PATIENT SIGNATURE_________________________________  NURSE SIGNATURE__________________________________  ________________________________________________________________________    CLEAR  LIQUID DIET   Foods Allowed                                                                     Foods Excluded  Coffee and tea, regular and decaf                             liquids that you cannot  Plain Jell-O in any flavor                                             see through such as: Fruit ices (not with fruit pulp)                                     milk, soups, orange juice  Iced Popsicles                                    All solid food Carbonated beverages, regular and diet  Cranberry, grape and apple juices Sports drinks like Gatorade Lightly seasoned clear broth or consume(fat free) Sugar, honey syrup  Sample Menu Breakfast                                Lunch                                     Supper Cranberry juice                    Beef broth                            Chicken broth Jell-O                                     Grape juice                           Apple juice Coffee or tea                        Jell-O                                      Popsicle                                                Coffee or tea                        Coffee or tea  _____________________________________________________________________                 Samantha BackersSandra J Simpson

## 2016-03-07 DIAGNOSIS — N201 Calculus of ureter: Secondary | ICD-10-CM | POA: Diagnosis not present

## 2016-03-13 ENCOUNTER — Ambulatory Visit (HOSPITAL_COMMUNITY): Payer: BLUE CROSS/BLUE SHIELD | Admitting: Anesthesiology

## 2016-03-13 ENCOUNTER — Ambulatory Visit (HOSPITAL_COMMUNITY)
Admission: RE | Admit: 2016-03-13 | Discharge: 2016-03-13 | Disposition: A | Discharge status: Home / Self Care | Payer: BLUE CROSS/BLUE SHIELD | Source: Ambulatory Visit | Attending: Urology | Admitting: Urology

## 2016-03-13 ENCOUNTER — Encounter (HOSPITAL_COMMUNITY)
Admission: RE | Disposition: A | Discharge status: Home / Self Care | Payer: Self-pay | Source: Ambulatory Visit | Attending: Urology

## 2016-03-13 ENCOUNTER — Encounter (HOSPITAL_COMMUNITY): Payer: Self-pay | Admitting: Anesthesiology

## 2016-03-13 DIAGNOSIS — N132 Hydronephrosis with renal and ureteral calculous obstruction: Secondary | ICD-10-CM | POA: Diagnosis not present

## 2016-03-13 DIAGNOSIS — D649 Anemia, unspecified: Secondary | ICD-10-CM | POA: Insufficient documentation

## 2016-03-13 DIAGNOSIS — Z880 Allergy status to penicillin: Secondary | ICD-10-CM | POA: Diagnosis not present

## 2016-03-13 DIAGNOSIS — N201 Calculus of ureter: Secondary | ICD-10-CM | POA: Diagnosis not present

## 2016-03-13 DIAGNOSIS — Z87442 Personal history of urinary calculi: Secondary | ICD-10-CM | POA: Diagnosis not present

## 2016-03-13 HISTORY — PX: CYSTOSCOPY WITH URETEROSCOPY, STONE BASKETRY AND STENT PLACEMENT: SHX6378

## 2016-03-13 SURGERY — CYSTOSCOPY, WITH CALCULUS MANIPULATION OR REMOVAL
Anesthesia: General | Laterality: Left

## 2016-03-13 MED ORDER — PROMETHAZINE HCL 25 MG/ML IJ SOLN: 6.2500 mg | INTRAMUSCULAR | Status: DC | PRN

## 2016-03-13 MED ORDER — FENTANYL CITRATE (PF) 100 MCG/2ML IJ SOLN
INTRAMUSCULAR | Status: AC
  Filled 2016-03-13: qty 2

## 2016-03-13 MED ORDER — DEXAMETHASONE SODIUM PHOSPHATE 10 MG/ML IJ SOLN
INTRAMUSCULAR | Status: DC | PRN
  Administered 2016-03-13: 10 mg via INTRAVENOUS

## 2016-03-13 MED ORDER — LIDOCAINE 2% (20 MG/ML) 5 ML SYRINGE
INTRAMUSCULAR | Status: AC
  Filled 2016-03-13: qty 5

## 2016-03-13 MED ORDER — FENTANYL CITRATE (PF) 100 MCG/2ML IJ SOLN: 25.0000 ug | INTRAMUSCULAR | Status: DC | PRN

## 2016-03-13 MED ORDER — PROPOFOL 10 MG/ML IV BOLUS
INTRAVENOUS | Status: DC | PRN
  Administered 2016-03-13: 200 mg via INTRAVENOUS
  Administered 2016-03-13 (×2): 100 mg via INTRAVENOUS

## 2016-03-13 MED ORDER — LIDOCAINE 2% (20 MG/ML) 5 ML SYRINGE
INTRAMUSCULAR | Status: DC | PRN
  Administered 2016-03-13: 100 mg via INTRAVENOUS

## 2016-03-13 MED ORDER — PROPOFOL 10 MG/ML IV BOLUS
INTRAVENOUS | Status: AC
  Filled 2016-03-13: qty 20

## 2016-03-13 MED ORDER — DEXTROSE 5 % IV SOLN
3.0000 g | INTRAVENOUS | Status: AC
  Administered 2016-03-13: 3 g via INTRAVENOUS
  Filled 2016-03-13: qty 3

## 2016-03-13 MED ORDER — 0.9 % SODIUM CHLORIDE (POUR BTL) OPTIME
TOPICAL | Status: DC | PRN
  Administered 2016-03-13: 1000 mL

## 2016-03-13 MED ORDER — SODIUM CHLORIDE 0.9 % IR SOLN
Status: DC | PRN
  Administered 2016-03-13: 4000 mL

## 2016-03-13 MED ORDER — MIDAZOLAM HCL 2 MG/2ML IJ SOLN
INTRAMUSCULAR | Status: AC
  Filled 2016-03-13: qty 2

## 2016-03-13 MED ORDER — CEFAZOLIN SODIUM-DEXTROSE 2-4 GM/100ML-% IV SOLN
2.0000 g | INTRAVENOUS | Status: DC
  Filled 2016-03-13: qty 100

## 2016-03-13 MED ORDER — ONDANSETRON HCL 4 MG/2ML IJ SOLN
INTRAMUSCULAR | Status: AC
  Filled 2016-03-13: qty 2

## 2016-03-13 MED ORDER — FENTANYL CITRATE (PF) 100 MCG/2ML IJ SOLN
INTRAMUSCULAR | Status: DC | PRN
  Administered 2016-03-13 (×2): 50 ug via INTRAVENOUS

## 2016-03-13 MED ORDER — MIDAZOLAM HCL 5 MG/5ML IJ SOLN
INTRAMUSCULAR | Status: DC | PRN
  Administered 2016-03-13: 2 mg via INTRAVENOUS

## 2016-03-13 MED ORDER — HYDROCODONE-ACETAMINOPHEN 5-325 MG PO TABS: 1.0000 | ORAL_TABLET | Freq: Four times a day (QID) | ORAL | 0 refills | Status: DC | PRN

## 2016-03-13 MED ORDER — LACTATED RINGERS IV SOLN
INTRAVENOUS | Status: DC | PRN
  Administered 2016-03-13: 16:00:00 via INTRAVENOUS

## 2016-03-13 MED ORDER — SUCCINYLCHOLINE CHLORIDE 200 MG/10ML IV SOSY
PREFILLED_SYRINGE | INTRAVENOUS | Status: AC
  Filled 2016-03-13: qty 10

## 2016-03-13 MED ORDER — DEXAMETHASONE SODIUM PHOSPHATE 10 MG/ML IJ SOLN
INTRAMUSCULAR | Status: AC
Start: 2016-03-13 — End: 2016-03-13
  Filled 2016-03-13: qty 1

## 2016-03-13 SURGICAL SUPPLY — 10 items
BAG URO CATCHER STRL LF (MISCELLANEOUS) ×3 IMPLANT
CATH INTERMIT  6FR 70CM (CATHETERS) ×3 IMPLANT
CLOTH BEACON ORANGE TIMEOUT ST (SAFETY) ×3 IMPLANT
GLOVE BIOGEL M STRL SZ7.5 (GLOVE) ×3 IMPLANT
GOWN STRL REUS W/TWL LRG LVL3 (GOWN DISPOSABLE) ×6 IMPLANT
GUIDEWIRE STR DUAL SENSOR (WIRE) ×3 IMPLANT
MANIFOLD NEPTUNE II (INSTRUMENTS) ×3 IMPLANT
PACK CYSTO (CUSTOM PROCEDURE TRAY) ×3 IMPLANT
TUBING CONNECTING 10 (TUBING) ×2 IMPLANT
TUBING CONNECTING 10' (TUBING) ×1

## 2016-03-13 NOTE — Anesthesia Procedure Notes (Signed)
Procedure Name: Intubation Date/Time: 03/13/2016 4:01 PM Performed by: Leroy LibmanEARDON, Natally Ribera L Patient Re-evaluated:Patient Re-evaluated prior to inductionOxygen Delivery Method: Circle system utilized Preoxygenation: Pre-oxygenation with 100% oxygen Ventilation: Mask ventilation without difficulty and Oral airway inserted - appropriate to patient size Laryngoscope Size: Hyacinth MeekerMiller and 2 Grade View: Grade II Tube type: Oral Tube size: 7.5 mm Number of attempts: 1 Airway Equipment and Method: Stylet Placement Confirmation: ETT inserted through vocal cords under direct vision,  positive ETCO2 and breath sounds checked- equal and bilateral Secured at: 22 cm Dental Injury: Teeth and Oropharynx as per pre-operative assessment

## 2016-03-13 NOTE — Discharge Instructions (Signed)
1. You may see some blood in the urine and may have some burning with urination for 48-72 hours. You also may notice that you have to urinate more frequently or urgently after your procedure which is normal.  °2. You should call should you develop an inability urinate, fever > 101, persistent nausea and vomiting that prevents you from eating or drinking to stay hydrated.  °

## 2016-03-13 NOTE — Interval H&P Note (Signed)
History and Physical Interval Note:  03/13/2016 3:12 PM  Samantha Simpson  has presented today for definitive treatment of her ureteral stone after prior stenting during an acute infection.  She has been treated with antibiotics and her infection has now cleared. She has a diagnosis of LEFT URETERAL CALCULUS  The various methods of treatment have been discussed with the patient and family. After consideration of risks, benefits and other options for treatment, the patient has consented to  Procedure(s) with comments: CYSTOSCOPY WITH URETEROSCOPY, STONE BASKETRY AND  POSSIBLE STENT PLACEMENT (Left) HOLMIUM LASER APPLICATION (Left) as a surgical intervention .  The patient's history has been reviewed, patient examined, no change in status, stable for surgery.  I have reviewed the patient's chart and labs.  Questions were answered to the patient's satisfaction.     Oluwasemilore Pascuzzi,LES

## 2016-03-13 NOTE — Op Note (Signed)
Preoperative diagnosis: Left ureteral calculus  Postoperative diagnosis: Left ureteral calculus  Procedure:  1. Cystoscopy 2. Left ureteroscopy and stone removal  Surgeon: Moody BruinsLester S. Tedi Hughson, Jr. M.D.  Anesthesia: General  Complications: None  EBL: Minimal  Specimens: 1. Left ureteral calculus  Disposition of specimens: [facility] for stone analysis  Indication: Samantha BackersSandra J Hughes is a 45 y.o. year old patient with urolithiasis. She recently presented with left ureteral obstruction due to a 3 mm left ureteral stone and fever.  She underwent ureteral stenting and antibiotic therapy.  She presents today for definitive stone treatment. After reviewing the management options for treatment, the patient elected to proceed with the above surgical procedure(s). We have discussed the potential benefits and risks of the procedure, side effects of the proposed treatment, the likelihood of the patient achieving the goals of the procedure, and any potential problems that might occur during the procedure or recuperation. Informed consent has been obtained.  Description of procedure:  The patient was taken to the operating room and general anesthesia was induced.  The patient was placed in the dorsal lithotomy position, prepped and draped in the usual sterile fashion, and preoperative antibiotics were administered. A preoperative time-out was performed.   Cystourethroscopy was performed.  The patient's urethra was examined and was normal. The bladder was then systematically examined in its entirety. There was no evidence for any bladder tumors, stones, or other mucosal pathology.    Attention then turned to the left ureteral orifice and the patient's indwelling ureteral stent was removed and replaced with a 0.38 sensor guidewire that was advanced up the left ureter into the renal pelvis under fluoroscopic guidance. The 6 Fr semirigid ureteroscope was then advanced into the ureter next to  the guidewire and the calculus was identified.  The stone was then removed from the ureter with a zero tip nitinol basket.  Reinspection of the ureter revealed no remaining visible stones or fragments.   No ureteral stent was felt to be necessary as the ureter was well dilated.  The bladder was then emptied and the procedure ended.  The patient appeared to tolerate the procedure well and without complications.  The patient was able to be awakened and transferred to the recovery unit in satisfactory condition.

## 2016-03-13 NOTE — Anesthesia Preprocedure Evaluation (Addendum)
Anesthesia Evaluation  Patient identified by MRN, date of birth, ID band Patient awake    Reviewed: Allergy & Precautions, NPO status , Patient's Chart, lab work & pertinent test results  Airway Mallampati: II  TM Distance: >3 FB Neck ROM: Full    Dental  (+) Teeth Intact, Dental Advisory Given   Pulmonary neg pulmonary ROS,    Pulmonary exam normal breath sounds clear to auscultation       Cardiovascular Exercise Tolerance: Good negative cardio ROS Normal cardiovascular exam Rhythm:Regular Rate:Normal     Neuro/Psych negative neurological ROS     GI/Hepatic negative GI ROS, Neg liver ROS,   Endo/Other  Morbid obesity  Renal/GU Renal disease (nephrolithiasis)     Musculoskeletal negative musculoskeletal ROS (+)   Abdominal   Peds  Hematology  (+) Blood dyscrasia, anemia ,   Anesthesia Other Findings Day of surgery medications reviewed with the patient.  Reproductive/Obstetrics negative OB ROS                            Anesthesia Physical Anesthesia Plan  ASA: III  Anesthesia Plan: General   Post-op Pain Management:    Induction: Intravenous  Airway Management Planned: LMA  Additional Equipment:   Intra-op Plan:   Post-operative Plan: Extubation in OR  Informed Consent: I have reviewed the patients History and Physical, chart, labs and discussed the procedure including the risks, benefits and alternatives for the proposed anesthesia with the patient or authorized representative who has indicated his/her understanding and acceptance.   Dental advisory given  Plan Discussed with: CRNA  Anesthesia Plan Comments: (Risks/benefits of general anesthesia discussed with patient including risk of damage to teeth, lips, gum, and tongue, nausea/vomiting, allergic reactions to medications, and the possibility of heart attack, stroke and death.  All patient questions answered.  Patient  wishes to proceed.)       Anesthesia Quick Evaluation

## 2016-03-13 NOTE — H&P (View-Only) (Signed)
Urology Consult   Physician requesting consult: Dr. Madilyn Hookees  Reason for consult: Left ureteral stone, fever  History of Present Illness: Samantha Simpson is a 45 y.o. who was seen in the ED on Monday with acute onset, severe left flank pain.  She was diagnosed with a left ureteral stone measuring 3 mm in the mid left ureter. She also was found to have a possible UTI although no culture was obtained.  She was started on Bactrim.  Dr. Annabell HowellsWrenn was called and he recommended she follow up with him the following morning.  She was not seen in the office and returned to the ED tonight with fever to 103. She states that fevers began about 24 hours ago. She denies flank pain now.  She has not seen a stone pass.  She has had associated nausea and vomiting.     She has a history of 4 prior stone episodes.  Past Medical History:  Diagnosis Date  . ASCUS (atypical squamous cells of undetermined significance) on Pap smear 1997  . BV (bacterial vaginosis) 2001  . CIN I (cervical intraepithelial neoplasia I) 2004  . Exposure to chlamydia, mucopurulent cervitis/nongonococcal urethritis 1997  . H/O fatigue 2010  . H/O: obesity 1997  . HGSIL (high grade squamous intraepithelial dysplasia)   . Increased BMI 2006  . Ovarian cyst, left 1996  . Renal stones     Past Surgical History:  Procedure Laterality Date  . CKC    . LAPAROSCOPY       Current Hospital Medications:  Home meds:  No outpatient prescriptions have been marked as taking for the 02/23/16 encounter [facility](Hospital Encounter).    Scheduled Meds: Continuous Infusions: PRN Meds:.  Allergies:  Allergies  Allergen Reactions  . Penicillins     Family History  Problem Relation Age of Onset  . Diabetes Mother   . Hypertension Mother     Social History:  reports that she has never smoked. She has never used smokeless tobacco. She reports that she does not drink alcohol or use drugs.  ROS: A complete review of systems was performed.  All  systems are negative except for pertinent findings as noted.  Physical Exam:  Vital signs in last 24 hours: Temp:  [103.1 F (39.5 C)] 103.1 F (39.5 C) (11/15 2011) Pulse Rate:  [102-125] 102 (11/15 2200) Resp:  [24-30] 30 (11/15 2200) BP: (126-174)/(76-115) 141/76 (11/15 2200) SpO2:  [96 %-100 %] 96 % (11/15 2200) Weight:  [108.9 kg (240 lb)] 108.9 kg (240 lb) (11/15 2142) Constitutional:  Alert and oriented, No acute distress Cardiovascular: Regular rhythm, mildly tachycardic, No JVD Respiratory: Normal respiratory effort, Lungs clear bilaterally GI: Abdomen is soft, nontender, nondistended, no abdominal masses GU: No CVA tenderness Lymphatic: No lymphadenopathy Neurologic: Grossly intact, no focal deficits Psychiatric: Normal mood and affect  Laboratory Data:   Recent Labs  02/21/16 2155 02/23/16 2030  WBC 10.7* 8.8  HGB 10.4* 11.0*  HCT 33.5* 33.7*  PLT 455* 197     Recent Labs  02/21/16 2155 02/23/16 2030  NA 138 131*  K 3.4* 3.7  CL 107 100*  GLUCOSE 170* 172*  BUN 7 13  CALCIUM 9.2 8.5*  CREATININE 0.77 1.74*     Results for orders placed or performed during the hospital encounter of 02/23/16 (from the past 24 hour(s))  Comprehensive metabolic panel     Status: Abnormal   Collection Time: 02/23/16  8:30 PM  Result Value Ref Range   Sodium 131 (L) 135 -  145 mmol/L   Potassium 3.7 3.5 - 5.1 mmol/L   Chloride 100 (L) 101 - 111 mmol/L   CO2 19 (L) 22 - 32 mmol/L   Glucose, Bld 172 (H) 65 - 99 mg/dL   BUN 13 6 - 20 mg/dL   Creatinine, Ser 1.74 (H) 0.44 - 1.00 mg/dL   Calcium 8.5 (L) 8.9 - 10.3 mg/dL   Total Protein 7.4 6.5 - 8.1 g/dL   Albumin 3.1 (L) 3.5 - 5.0 g/dL   AST 27 15 - 41 U/L   ALT 17 14 - 54 U/L   Alkaline Phosphatase 72 38 - 126 U/L   Total Bilirubin 1.8 (H) 0.3 - 1.2 mg/dL   GFR calc non Af Amer 34 (L) >60 mL/min   GFR calc Af Amer 39 (L) >60 mL/min   Anion gap 12 5 - 15  CBC with Differential     Status: Abnormal   Collection  Time: 02/23/16  8:30 PM  Result Value Ref Range   WBC 8.8 4.0 - 10.5 K/uL   RBC 4.17 3.87 - 5.11 MIL/uL   Hemoglobin 11.0 (L) 12.0 - 15.0 g/dL   HCT 33.7 (L) 36.0 - 46.0 %   MCV 80.8 78.0 - 100.0 fL   MCH 26.4 26.0 - 34.0 pg   MCHC 32.6 30.0 - 36.0 g/dL   RDW 16.2 (H) 11.5 - 15.5 %   Platelets 197 150 - 400 K/uL   Neutrophils Relative % 94 %   Neutro Abs 8.2 (H) 1.7 - 7.7 K/uL   Lymphocytes Relative 6 %   Lymphs Abs 0.5 (L) 0.7 - 4.0 K/uL   Monocytes Relative 1 %   Monocytes Absolute 0.0 (L) 0.1 - 1.0 K/uL   Eosinophils Relative 0 %   Eosinophils Absolute 0.0 0.0 - 0.7 K/uL   Basophils Relative 0 %   Basophils Absolute 0.0 0.0 - 0.1 K/uL  I-Stat beta hCG blood, ED     Status: None   Collection Time: 02/23/16  8:39 PM  Result Value Ref Range   I-stat hCG, quantitative <5.0 <5 mIU/mL   Comment 3          I-Stat CG4 Lactic Acid, ED     Status: Abnormal   Collection Time: 02/23/16  8:42 PM  Result Value Ref Range   Lactic Acid, Venous 2.63 (HH) 0.5 - 1.9 mmol/L   Comment NOTIFIED PHYSICIAN    No results found for this or any previous visit (from the past 240 hour(s)).  Renal Function:  Recent Labs  02/21/16 2155 02/23/16 2030  CREATININE 0.77 1.74*   Estimated Creatinine Clearance: 49.6 mL/min (by C-G formula based on SCr of 1.74 mg/dL (H)).  Radiologic Imaging: Dg Chest 2 View  Result Date: 02/23/2016 CLINICAL DATA:  Fever, headache and chills.  Onset yesterday. EXAM: CHEST  2 VIEW COMPARISON:  None. FINDINGS: The lungs are clear. The pulmonary vasculature is normal. Heart size is normal. Hilar and mediastinal contours are unremarkable. There is no pleural effusion. IMPRESSION: No active cardiopulmonary disease. Electronically Signed   By: Daniel R Mitchell M.D.   On: 02/23/2016 21:05   Ct Renal Stone Study  Result Date: 02/22/2016 CLINICAL DATA:  Left flank pain for 1 week. No hematuria. Past history of renal stones. EXAM: CT ABDOMEN AND PELVIS WITHOUT CONTRAST  TECHNIQUE: Multidetector CT imaging of the abdomen and pelvis was performed following the standard protocol without IV contrast. COMPARISON:  06/12/2003 CT FINDINGS: Lower chest: No acute abnormality. Hepatobiliary: No focal liver abnormality   is seen. No gallstones, gallbladder wall thickening, or biliary dilatation. Pancreas: Unremarkable. No pancreatic ductal dilatation or surrounding inflammatory changes. Spleen: Normal in size without focal abnormality. Adrenals/Urinary Tract: Bilateral normal appearing adrenal glands. There is mild nephromegaly with perinephric fat stranding on the left with mild to moderate left-sided hydroureteronephrosis secondary to a 3 mm left ureteral stone at the pelvic brim. No nephrolithiasis. No bladder calculi. Stomach/Bowel: Stomach is within normal limits. Appendix appears normal. No evidence of bowel wall thickening, distention, or inflammatory changes. Diffuse spasm of the right colon. Vascular/Lymphatic: No significant vascular findings are present. No enlarged abdominal or pelvic lymph nodes. Reproductive: 4 cm left ovarian cyst with Hounsfield unit of 15. Unremarkable right ovary and uterus. Other: No abdominal wall hernia or abnormality. No abdominopelvic ascites. Musculoskeletal: No acute or significant osseous findings. IMPRESSION: 3 mm mid left ureteral stone at the pelvic brim causing mild-to-moderate hydroureteronephrosis and left-sided nephromegaly. 4 cm left ovarian cysts without worrisome features. No additional follow-up for incidental adnexal cystic masses less than or equal to 5 cm in premenopausal patients. Electronically Signed   By: Tollie Ethavid  Kwon M.D.   On: 02/22/2016 00:24    I independently reviewed the above imaging studies.  Impression/Recommendation: Left ureteral stone and fever: She will proceed to the OR immediately for cystoscopy and ureteral stent tonight.  We have reviewed potential risks/complications of the procedure and she gives informed  consent to proceed.  Continue ceftriaxone.  No culture is pending and will be checked in the OR.    Juanito Gonyer,LES 02/23/2016, 10:16 PM  Moody BruinsLester S. Brevin Mcfadden Jr. MD   CC: Dr. Madilyn Hookees

## 2016-03-13 NOTE — Transfer of Care (Signed)
Immediate Anesthesia Transfer of Care Note  Patient: Samantha Simpson  Procedure(s) Performed: Procedure(s) with comments: CYSTOSCOPY WITH URETEROSCOPY, STONE BASKETRY AND  left ureter removal of stent (Left) - 45 MINS [phone]  NO INSURANCE ON FILE  Patient Location: PACU  Anesthesia Type:General  Level of Consciousness: awake and alert   Airway & Oxygen Therapy: Patient Spontanous Breathing and Patient connected to face mask oxygen  Post-op Assessment: Report given to RN and Post -op Vital signs reviewed and stable  Post vital signs: Reviewed and stable  Last Vitals:  Vitals:   03/13/16 1443  BP: (!) 157/105  Pulse: 96  Resp: 18  Temp: 36.9 C    Last Pain:  Vitals:   03/13/16 1443  TempSrc: Oral      Patients Stated Pain Goal: 3 (03/13/16 1455)  Complications: No apparent anesthesia complications

## 2016-03-13 NOTE — Anesthesia Postprocedure Evaluation (Signed)
Anesthesia Post Note  Patient: Samantha BackersSandra J Hughes  Procedure(s) Performed: Procedure(s) (LRB): CYSTOSCOPY WITH URETEROSCOPY, STONE BASKETRY AND  left ureter removal of stent (Left)  Patient location during evaluation: PACU Anesthesia Type: General Level of consciousness: awake and alert Pain management: pain level controlled Vital Signs Assessment: post-procedure vital signs reviewed and stable Respiratory status: spontaneous breathing, nonlabored ventilation, respiratory function stable and patient connected to nasal cannula oxygen Cardiovascular status: blood pressure returned to baseline and stable Postop Assessment: no signs of nausea or vomiting Anesthetic complications: no    Last Vitals:  Vitals:   03/13/16 1700 03/13/16 1715  BP: (!) 156/97 (!) 152/91  Pulse: 67 73  Resp: 16 15  Temp: 36.7 C 36.6 C    Last Pain:  Vitals:   03/13/16 1700  TempSrc:   PainSc: 0-No pain                 Cecile HearingStephen Edward Turk

## 2016-03-14 ENCOUNTER — Encounter (HOSPITAL_COMMUNITY): Payer: Self-pay | Admitting: Urology

## 2016-04-27 DIAGNOSIS — N201 Calculus of ureter: Secondary | ICD-10-CM | POA: Diagnosis not present

## 2016-04-28 DIAGNOSIS — N201 Calculus of ureter: Secondary | ICD-10-CM | POA: Diagnosis not present

## 2016-06-08 ENCOUNTER — Ambulatory Visit (INDEPENDENT_AMBULATORY_CARE_PROVIDER_SITE_OTHER): Payer: BLUE CROSS/BLUE SHIELD | Admitting: Emergency Medicine

## 2016-06-08 VITALS — BP 138/80 | HR 88 | Temp 98.1°F | Resp 18 | Ht 65.0 in | Wt 271.2 lb

## 2016-06-08 DIAGNOSIS — J209 Acute bronchitis, unspecified: Secondary | ICD-10-CM

## 2016-06-08 DIAGNOSIS — R059 Cough, unspecified: Secondary | ICD-10-CM

## 2016-06-08 DIAGNOSIS — R6889 Other general symptoms and signs: Secondary | ICD-10-CM

## 2016-06-08 DIAGNOSIS — R05 Cough: Secondary | ICD-10-CM

## 2016-06-08 HISTORY — DX: Acute bronchitis, unspecified: J20.9

## 2016-06-08 LAB — POCT INFLUENZA A/B
INFLUENZA A, POC: NEGATIVE
Influenza B, POC: NEGATIVE

## 2016-06-08 MED ORDER — AZITHROMYCIN 250 MG PO TABS: ORAL_TABLET | ORAL | 0 refills | Status: DC

## 2016-06-08 NOTE — Progress Notes (Signed)
Samantha Simpson 46 y.o.   Chief Complaint  Patient presents with  . Cough    and chest congestion     HISTORY OF PRESENT ILLNESS: This is a 46 y.o. female complaining of flu-like symptoms x 1-2 weeks; mostly c/o cough and congestion.  Cough  This is a new problem. The current episode started 1 to 4 weeks ago. The problem has been waxing and waning. The problem occurs every few minutes. The cough is productive of sputum. Associated symptoms include nasal congestion. Pertinent negatives include no chest pain, chills, ear congestion, ear pain, eye redness, fever, headaches, hemoptysis, myalgias, rash, sore throat, shortness of breath or wheezing. There is no history of asthma, bronchitis, COPD or pneumonia.     Prior to Admission medications   Medication Sig Start Date End Date Taking? Authorizing Provider  HYDROcodone-acetaminophen (NORCO/VICODIN) 5-325 MG tablet Take 1-2 tablets by mouth every 6 (six) hours as needed. Patient not taking: Reported on 06/08/2016 02/25/16   Heloise Purpura, MD  HYDROcodone-acetaminophen (NORCO/VICODIN) 5-325 MG tablet Take 1-2 tablets by mouth every 6 (six) hours as needed. Patient not taking: Reported on 06/08/2016 03/13/16   Heloise Purpura, MD  ondansetron (ZOFRAN) 4 MG tablet Take 1 tablet (4 mg total) by mouth every 6 (six) hours. Patient not taking: Reported on 06/08/2016 02/22/16   Marlon Pel, PA-C    Allergies  Allergen Reactions  . Penicillins Rash    Has patient had a PCN reaction causing immediate rash, facial/tongue/throat swelling, SOB or lightheadedness with hypotension: Yes Has patient had a PCN reaction causing severe rash involving mucus membranes or skin necrosis: No Has patient had a PCN reaction that required hospitalization: No Has patient had a PCN reaction occurring within the last 10 years: No If all of the above answers are "NO", then may proceed with Cephalosporin use. (Reports she can sometimes tolerate this)     Patient Active  Problem List   Diagnosis Date Noted  . Ureteral stone with hydronephrosis 02/24/2016  . Routine gynecological examination 10/19/2011  . Obesity 10/19/2011  . ASCUS with positive high risk HPV 10/18/2008    Past Medical History:  Diagnosis Date  . ASCUS (atypical squamous cells of undetermined significance) on Pap smear 1997  . BV (bacterial vaginosis) 2001  . CIN I (cervical intraepithelial neoplasia I) 2004  . Exposure to chlamydia, mucopurulent cervitis/nongonococcal urethritis 1997  . H/O fatigue 2010  . H/O: obesity 1997  . HGSIL (high grade squamous intraepithelial dysplasia)   . Increased BMI 2006  . Ovarian cyst, left 1996  . Renal stones     Past Surgical History:  Procedure Laterality Date  . CKC    . CYSTOSCOPY WITH STENT PLACEMENT Left 02/23/2016   Procedure: CYSTOSCOPY WITH STENT PLACEMENT;  Surgeon: Heloise Purpura, MD;  Location: [facility];  Service: Urology;  Laterality: Left;  . CYSTOSCOPY WITH URETEROSCOPY, STONE BASKETRY AND STENT PLACEMENT Left 03/13/2016   Procedure: CYSTOSCOPY WITH URETEROSCOPY, STONE BASKETRY AND  left ureter removal of stent;  Surgeon: Heloise Purpura, MD;  Location: [facility];  Service: Urology;  Laterality: Left;  . LAPAROSCOPY  1999   left ovary    Social History   Social History  . Marital status: Single    Spouse name: N/A  . Number of children: N/A  . Years of education: N/A   Occupational History  . Not on file.   Social History Main Topics  . Smoking status: Never Smoker  . Smokeless tobacco: Never Used  . Alcohol  use No  . Drug use: No  . Sexual activity: Yes    Birth control/ protection: None   Other Topics Concern  . Not on file   Social History Narrative  . No narrative on file    Family History  Problem Relation Age of Onset  . Diabetes Mother   . Hypertension Mother      Review of Systems  Constitutional: Positive for malaise/fatigue. Negative for chills and fever.  HENT: Positive for congestion. Negative  for ear pain, nosebleeds and sore throat.   Eyes: Negative for discharge and redness.  Respiratory: Positive for cough. Negative for hemoptysis, shortness of breath and wheezing.   Cardiovascular: Negative for chest pain and palpitations.  Gastrointestinal: Negative for abdominal pain, diarrhea, nausea and vomiting.  Genitourinary: Negative for dysuria and hematuria.  Musculoskeletal: Negative for myalgias and neck pain.  Skin: Negative for rash.  Neurological: Negative for dizziness and headaches.  All other systems reviewed and are negative.  Vitals:   06/08/16 1108  BP: 138/80  Pulse: 88  Resp: 18  Temp: 98.1 F (36.7 C)     Physical Exam  Constitutional: She is oriented to person, place, and time. She appears well-developed and well-nourished.  HENT:  Head: Normocephalic and atraumatic.  Nose: Mucosal edema present.  Mouth/Throat: Posterior oropharyngeal erythema present. No oropharyngeal exudate.  Eyes: Conjunctivae and EOM are normal. Pupils are equal, round, and reactive to light.  Neck: Normal range of motion. Neck supple. No JVD present. No thyromegaly present.  Cardiovascular: Normal rate, regular rhythm and normal heart sounds.   Pulmonary/Chest: Effort normal and breath sounds normal.  Abdominal: Soft. She exhibits no distension. There is no tenderness.  Musculoskeletal: Normal range of motion.  Lymphadenopathy:    She has no cervical adenopathy.  Neurological: She is alert and oriented to person, place, and time. No sensory deficit. She exhibits normal muscle tone.  Skin: Skin is warm and dry. Capillary refill takes less than 2 seconds.  Psychiatric: She has a normal mood and affect. Her behavior is normal.  Vitals reviewed.    ASSESSMENT & PLAN: Dois DavenportSandra was seen today for cough.  Diagnoses and all orders for this visit:  Acute bronchitis, unspecified organism  Cough -     POCT Influenza A/B  Flu-like symptoms -     POCT Influenza A/B  Other  orders -     azithromycin (ZITHROMAX) 250 MG tablet; Sig as indicated    Patient Instructions       IF you received an x-ray today, you will receive an invoice [facility]. Please contact [facility] at [phone] with questions or concerns regarding your invoice.   IF you received labwork today, you will receive an invoice from [facility]. Please contact [facility] at [phone] with questions or concerns regarding your invoice.   Our billing staff will not be able to assist you with questions regarding bills from these companies.  You will be contacted with the lab results as soon as they are available. The fastest way to get your results is to activate your My Chart account. Instructions are located on the last page of this paperwork. If you have not heard from us regarding the results in 2 weeks, please contact this office.      Acute Bronchitis, Adult Acute bronchitis is when air tubes (bronchi) in the lungs suddenly get swollen. The condition can make it hard to breathe. It can also cause these symptoms:  A cough.  Coughing up clear, yellow,  or green mucus.  Wheezing.  Chest congestion.  Shortness of breath.  A fever.  Body aches.  Chills.  A sore throat. Follow these instructions at home: Medicines   Take over-the-counter and prescription medicines only as told by your doctor.  If you were prescribed an antibiotic medicine, take it as told by your doctor. Do not stop taking the antibiotic even if you start to feel better. General instructions   Rest.  Drink enough fluids to keep your pee (urine) clear or pale yellow.  Avoid smoking and secondhand smoke. If you smoke and you need help quitting, ask your doctor. Quitting will help your lungs heal faster.  Use an inhaler, cool mist vaporizer, or humidifier as told by your doctor.  Keep all follow-up visits as told by your doctor. This is important. How is this prevented? To lower  your risk of getting this condition again:  Wash your hands often with soap and water. If you cannot use soap and water, use hand sanitizer.  Avoid contact with people who have cold symptoms.  Try not to touch your hands to your mouth, nose, or eyes.  Make sure to get the flu shot every year. Contact a doctor if:  Your symptoms do not get better in 2 weeks. Get help right away if:  You cough up blood.  You have chest pain.  You have very bad shortness of breath.  You become dehydrated.  You faint (pass out) or keep feeling like you are going to pass out.  You keep throwing up (vomiting).  You have a very bad headache.  Your fever or chills gets worse. This information is not intended to replace advice given to you by your health care provider. Make sure you discuss any questions you have with your health care provider. Document Released: 09/13/2007 Document Revised: 11/03/2015 Document Reviewed: 09/15/2015 Elsevier Interactive Patient Education  2017 Elsevier Inc.      Edwina Barth, MD [facility]

## 2016-06-08 NOTE — Patient Instructions (Addendum)
     IF you received an x-ray today, you will receive an invoice from [facility]. Please contact  Radiology at [phone] with questions or concerns regarding your invoice.   IF you received labwork today, you will receive an invoice from [facility]. Please contact [facility] at [phone] with questions or concerns regarding your invoice.   Our billing staff will not be able to assist you with questions regarding bills from these companies.  You will be contacted with the lab results as soon as they are available. The fastest way to get your results is to activate your My Chart account. Instructions are located on the last page of this paperwork. If you have not heard from us regarding the results in 2 weeks, please contact this office.      Acute Bronchitis, Adult Acute bronchitis is when air tubes (bronchi) in the lungs suddenly get swollen. The condition can make it hard to breathe. It can also cause these symptoms:  A cough.  Coughing up clear, yellow, or green mucus.  Wheezing.  Chest congestion.  Shortness of breath.  A fever.  Body aches.  Chills.  A sore throat.  Follow these instructions at home: Medicines  Take over-the-counter and prescription medicines only as told by your doctor.  If you were prescribed an antibiotic medicine, take it as told by your doctor. Do not stop taking the antibiotic even if you start to feel better. General instructions  Rest.  Drink enough fluids to keep your pee (urine) clear or pale yellow.  Avoid smoking and secondhand smoke. If you smoke and you need help quitting, ask your doctor. Quitting will help your lungs heal faster.  Use an inhaler, cool mist vaporizer, or humidifier as told by your doctor.  Keep all follow-up visits as told by your doctor. This is important. How is this prevented? To lower your risk of getting this condition again:  Wash your hands often with soap and water. If you cannot  use soap and water, use hand sanitizer.  Avoid contact with people who have cold symptoms.  Try not to touch your hands to your mouth, nose, or eyes.  Make sure to get the flu shot every year.  Contact a doctor if:  Your symptoms do not get better in 2 weeks. Get help right away if:  You cough up blood.  You have chest pain.  You have very bad shortness of breath.  You become dehydrated.  You faint (pass out) or keep feeling like you are going to pass out.  You keep throwing up (vomiting).  You have a very bad headache.  Your fever or chills gets worse. This information is not intended to replace advice given to you by your health care provider. Make sure you discuss any questions you have with your health care provider. Document Released: 09/13/2007 Document Revised: 11/03/2015 Document Reviewed: 09/15/2015 Elsevier Interactive Patient Education  2017 Elsevier Inc.  

## 2018-01-13 IMAGING — CT CT RENAL STONE PROTOCOL
2 of 4 series · 15 of 46 positions shown, 17 images · non-contrast
Comparison: 06/12/2003 CT

CLINICAL DATA: Left flank pain for 1 week. No hematuria. Past
history of renal stones.

EXAM:
CT ABDOMEN AND PELVIS WITHOUT CONTRAST
TECHNIQUE: Multidetector CT imaging of the abdomen and pelvis was performed
following the standard protocol without IV contrast.

[Series 2: renal stone 5mm · axial · 0.78mm/px · z∈[-1075,-600]mm · 12 of 105 slices shown, 14 images]
[im 5/105  soft-tissue]
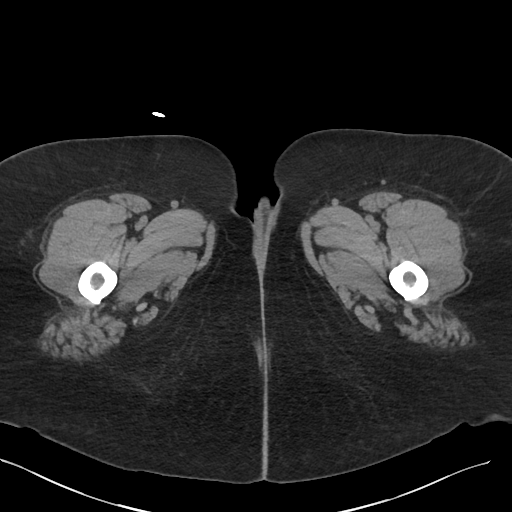
[im 5/105  bone]
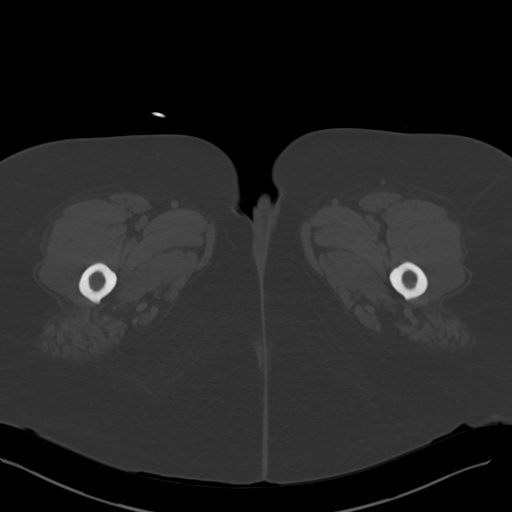
[im 14/105  soft-tissue]
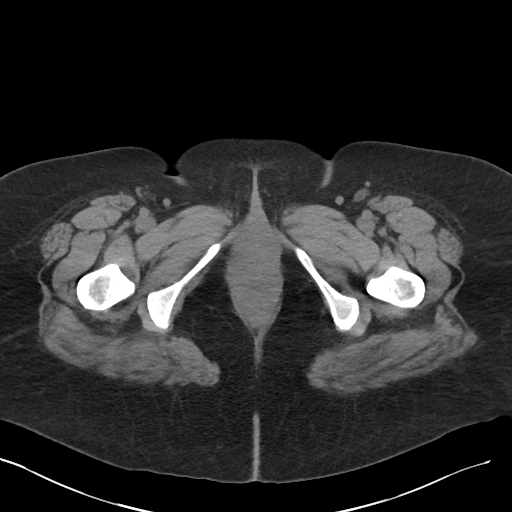
[im 22/105  soft-tissue]
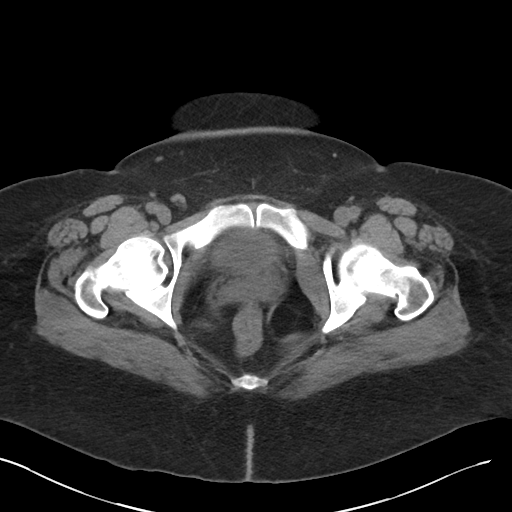
[im 31/105  soft-tissue]
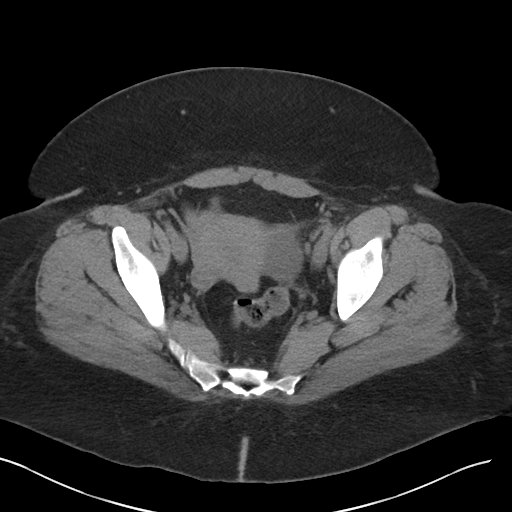
[im 40/105  soft-tissue]
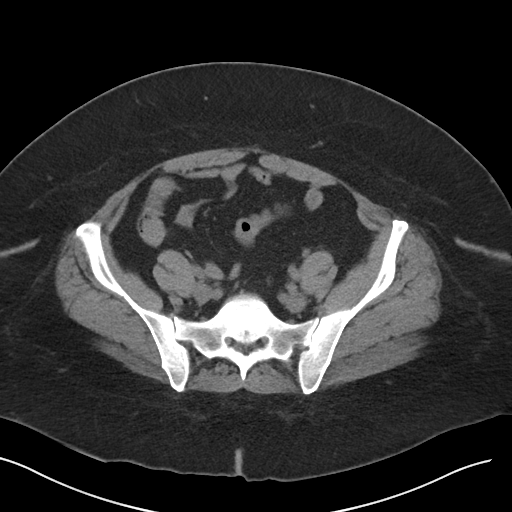
[im 48/105  soft-tissue]
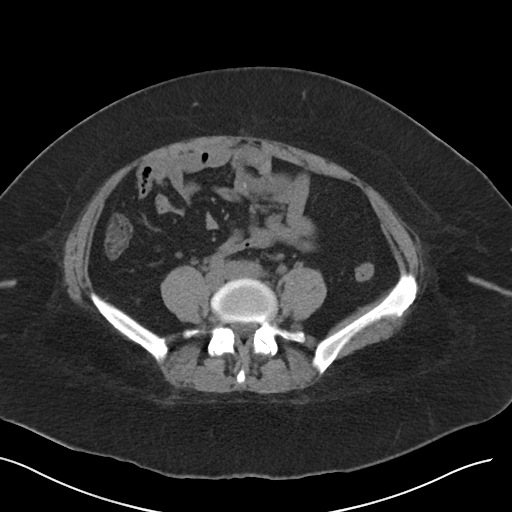
[im 57/105  soft-tissue]
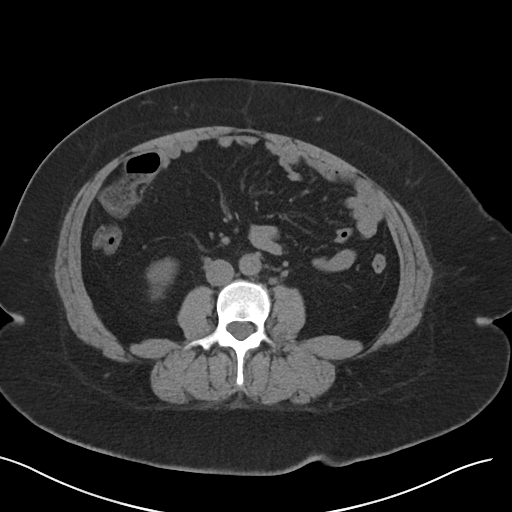
[im 66/105  soft-tissue]
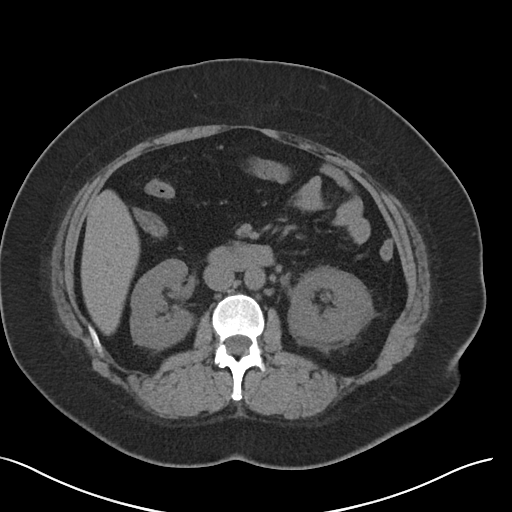
[im 74/105  soft-tissue]
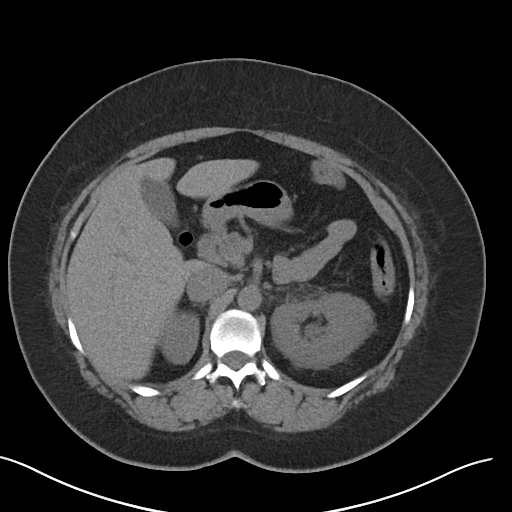
[im 74/105  bone]
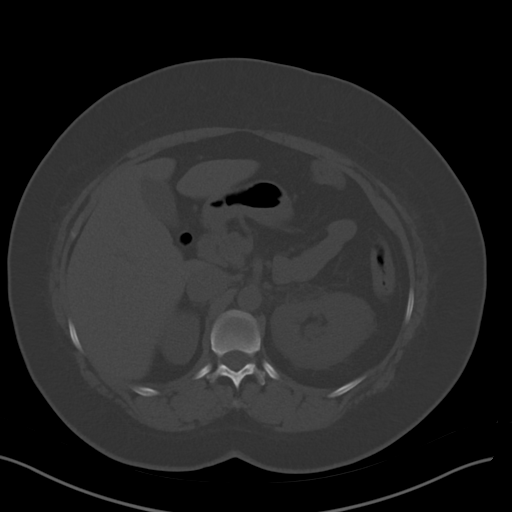
[im 83/105  soft-tissue]
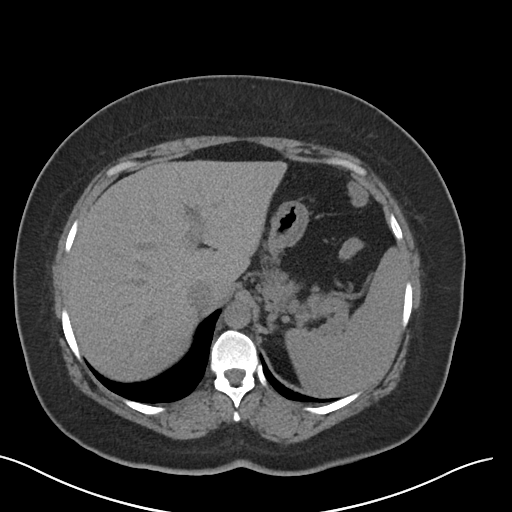
[im 92/105  soft-tissue]
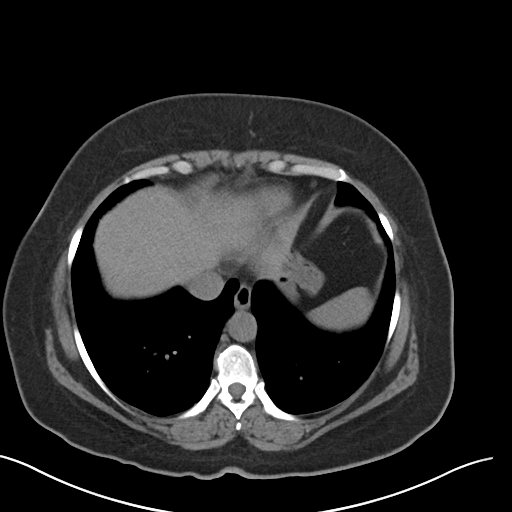
[im 100/105  soft-tissue]
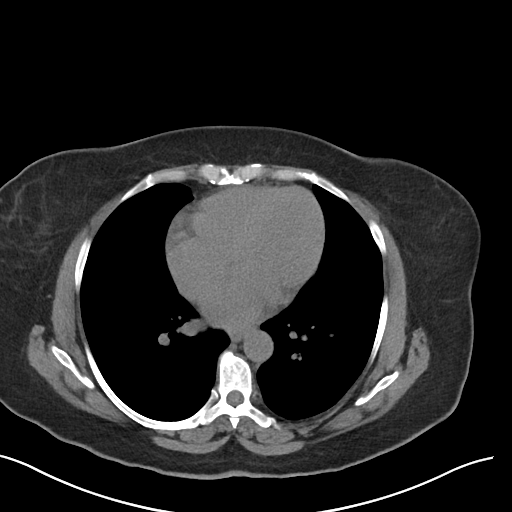

[Series 4: renal stone 3.0 cor · coronal · 0.64mm/px · 3 of 84 slices shown]
[im 28/84  soft-tissue]
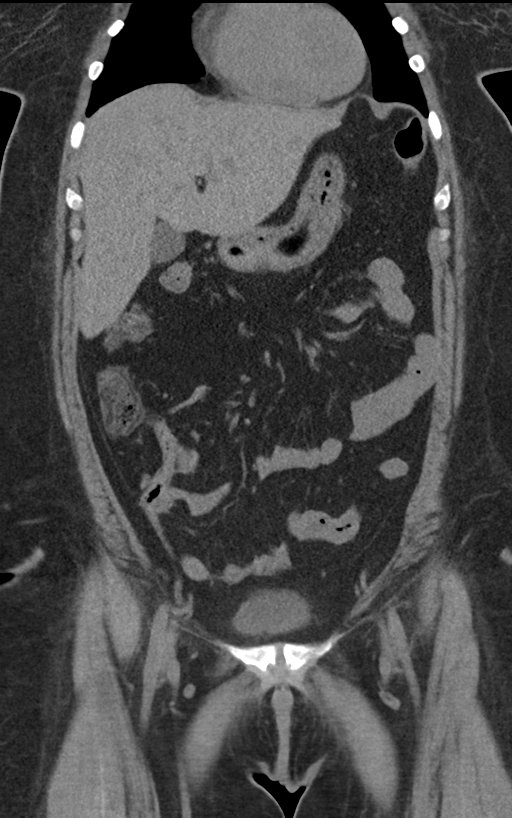
[im 37/84  soft-tissue]
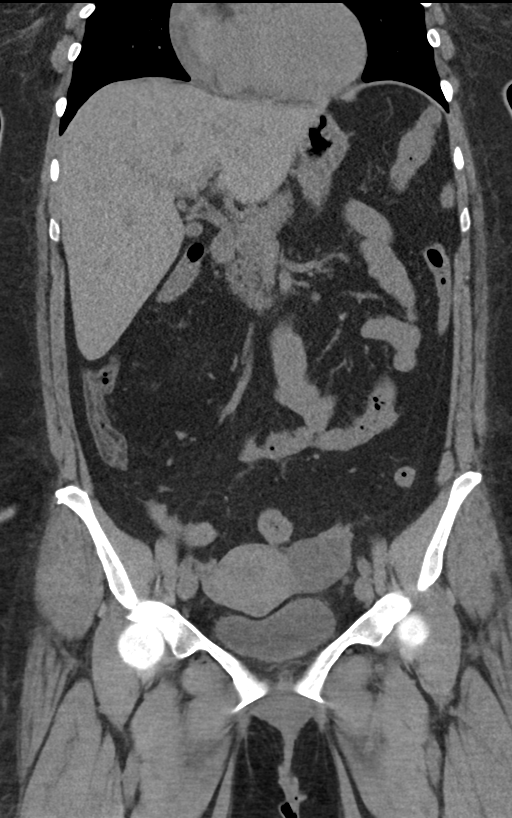
[im 47/84  soft-tissue]
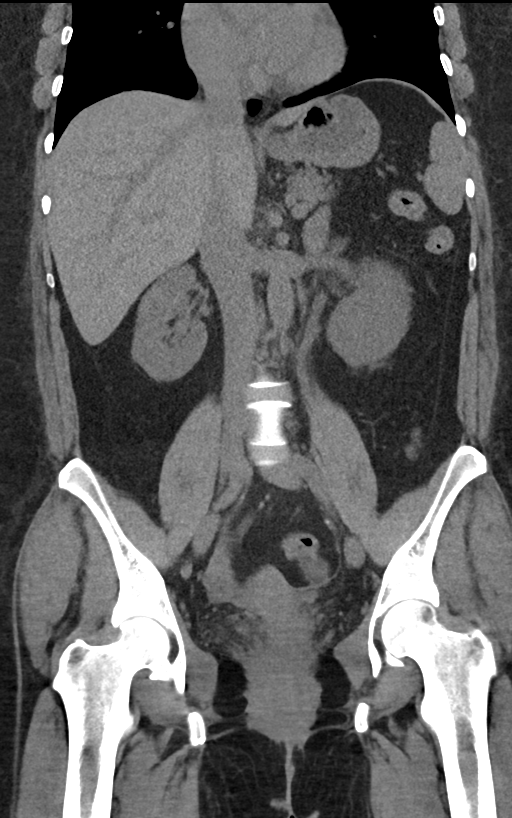

[15 of 46 positions shown; findings below may reference images not displayed]

FINDINGS: Lower chest: No acute abnormality.

Hepatobiliary: No focal liver abnormality is seen. No gallstones,
gallbladder wall thickening, or biliary dilatation.

Pancreas: Unremarkable. No pancreatic ductal dilatation or
surrounding inflammatory changes.

Spleen: Normal in size without focal abnormality.

Adrenals/Urinary Tract: Bilateral normal appearing adrenal glands.
There is mild nephromegaly with perinephric fat stranding on the
left with mild to moderate left-sided hydroureteronephrosis
secondary to a 3 mm left ureteral stone at the pelvic brim. No
nephrolithiasis. No bladder calculi.

Stomach/Bowel: Stomach is within normal limits. Appendix appears
normal. No evidence of bowel wall thickening, distention, or
inflammatory changes. Diffuse spasm of the right colon.

Vascular/Lymphatic: No significant vascular findings are present. No
enlarged abdominal or pelvic lymph nodes.

Reproductive: 4 cm left ovarian cyst with Hounsfield unit of 15.
Unremarkable right ovary and uterus.

Other: No abdominal wall hernia or abnormality. No abdominopelvic
ascites.

Musculoskeletal: No acute or significant osseous findings.
IMPRESSION: 3 mm mid left ureteral stone at the pelvic brim causing
mild-to-moderate hydroureteronephrosis and left-sided nephromegaly.

4 cm left ovarian cysts without worrisome features. No additional
follow-up for incidental adnexal cystic masses less than or equal to
5 cm in premenopausal patients.

## 2019-02-26 ENCOUNTER — Encounter (HOSPITAL_COMMUNITY): Payer: Self-pay

## 2019-02-26 ENCOUNTER — Emergency Department (HOSPITAL_COMMUNITY): Payer: 59

## 2019-02-26 ENCOUNTER — Other Ambulatory Visit: Payer: Self-pay

## 2019-02-26 ENCOUNTER — Emergency Department (HOSPITAL_COMMUNITY)
Admission: EM | Admit: 2019-02-26 | Discharge: 2019-02-26 | Disposition: A | Discharge status: Home / Self Care | Payer: 59 | Attending: Emergency Medicine | Admitting: Emergency Medicine

## 2019-02-26 DIAGNOSIS — R509 Fever, unspecified: Secondary | ICD-10-CM | POA: Insufficient documentation

## 2019-02-26 DIAGNOSIS — R05 Cough: Secondary | ICD-10-CM | POA: Diagnosis not present

## 2019-02-26 DIAGNOSIS — R0981 Nasal congestion: Secondary | ICD-10-CM | POA: Insufficient documentation

## 2019-02-26 DIAGNOSIS — U071 COVID-19: Secondary | ICD-10-CM | POA: Diagnosis not present

## 2019-02-26 DIAGNOSIS — R0602 Shortness of breath: Secondary | ICD-10-CM | POA: Insufficient documentation

## 2019-02-26 NOTE — ED Triage Notes (Signed)
Pt states she tested positive for COVID on 11/15. Pt is c/o SHOB. Pt states it's hard to catch her breath and she feels light headed.

## 2019-02-26 NOTE — Discharge Instructions (Addendum)
Please return to the department if symptoms worsen.

## 2019-02-26 NOTE — ED Provider Notes (Signed)
[facility]   CSN: [id number] Arrival date & time: 02/26/19  1124     History   Chief Complaint Chief Complaint  Patient presents with  . Cov+/SOB    HPI [name] is a 48 y.o. female.     The history is provided by the patient.  Shortness of Breath Severity:  Mild Onset quality:  Gradual Duration:  2 days Timing:  Intermittent Progression:  Waxing and waning Chronicity:  New Context comment:  +covid about one week ago Relieved by:  Nothing Worsened by:  Nothing Associated symptoms: cough and fever   Associated symptoms: no abdominal pain, no chest pain, no ear pain, no rash, no sore throat, no sputum production and no vomiting   Risk factors: no hx of PE/DVT     Past Medical History:  Diagnosis Date  . ASCUS (atypical squamous cells of undetermined significance) on Pap smear 1997  . BV (bacterial vaginosis) 2001  . CIN I (cervical intraepithelial neoplasia I) 2004  . Exposure to chlamydia, mucopurulent cervitis/nongonococcal urethritis 1997  . H/O fatigue 2010  . H/O: obesity 1997  . HGSIL (high grade squamous intraepithelial dysplasia)   . Increased BMI 2006  . Ovarian cyst, left 1996  . Renal stones     Patient Active Problem List   Diagnosis Date Noted  . Acute bronchitis 06/08/2016  . Ureteral stone with hydronephrosis 02/24/2016  . Routine gynecological examination 10/19/2011  . Obesity 10/19/2011  . ASCUS with positive high risk HPV 10/18/2008    Past Surgical History:  Procedure Laterality Date  . CKC    . CYSTOSCOPY WITH STENT PLACEMENT Left 02/23/2016   Procedure: CYSTOSCOPY WITH STENT PLACEMENT;  Surgeon: Heloise PurpuraLester Borden, MD;  Location: [facility];  Service: Urology;  Laterality: Left;  . CYSTOSCOPY WITH URETEROSCOPY, STONE BASKETRY AND STENT PLACEMENT Left 03/13/2016   Procedure: CYSTOSCOPY WITH URETEROSCOPY, STONE BASKETRY AND  left ureter removal of stent;  Surgeon: Heloise PurpuraLester Borden, MD;   Location: [facility];  Service: Urology;  Laterality: Left;  . LAPAROSCOPY  1999   left ovary     OB History    Gravida  1   Para  1   Term      Preterm  1   AB      Living  1     SAB      TAB      Ectopic      Multiple      Live Births  1            Home Medications    Prior to Admission medications   Medication Sig Start Date End Date Taking? Authorizing Provider  azithromycin (ZITHROMAX) 250 MG tablet Sig as indicated 06/08/16   Georgina QuintSagardia, Miguel Jose, MD  HYDROcodone-acetaminophen (NORCO/VICODIN) 5-325 MG tablet Take 1-2 tablets by mouth every 6 (six) hours as needed. Patient not taking: Reported on 06/08/2016 02/25/16   Heloise PurpuraBorden, Lester, MD  HYDROcodone-acetaminophen (NORCO/VICODIN) 5-325 MG tablet Take 1-2 tablets by mouth every 6 (six) hours as needed. Patient not taking: Reported on 06/08/2016 03/13/16   Heloise PurpuraBorden, Lester, MD  ondansetron (ZOFRAN) 4 MG tablet Take 1 tablet (4 mg total) by mouth every 6 (six) hours. Patient not taking: Reported on 06/08/2016 02/22/16   Marlon PelGreene, Tiffany, PA-C    Family History Family History  Problem Relation Age of Onset  . Diabetes Mother   . Hypertension Mother     Social History Social History   Tobacco  Use  . Smoking status: Never Smoker  . Smokeless tobacco: Never Used  Substance Use Topics  . Alcohol use: No  . Drug use: No     Allergies   Penicillins   Review of Systems Review of Systems  Constitutional: Positive for fever. Negative for chills.  HENT: Negative for ear pain and sore throat.   Eyes: Negative for pain and visual disturbance.  Respiratory: Positive for cough and shortness of breath. Negative for sputum production.   Cardiovascular: Negative for chest pain and palpitations.  Gastrointestinal: Negative for abdominal pain and vomiting.  Genitourinary: Negative for dysuria and hematuria.  Musculoskeletal: Negative for arthralgias and back pain.  Skin: Negative for color change and rash.   Neurological: Negative for seizures and syncope.  All other systems reviewed and are negative.    Physical Exam Updated Vital Signs  ED Triage Vitals [02/26/19 1139]  Enc Vitals Group     BP (!) 162/117     Pulse Rate 90     Resp (!) 22     Temp 98.6 F (37 C)     Temp Source Oral     SpO2 100 %     Weight 290 lb (131.5 kg)     Height 5\' 5"  (1.651 m)     Head Circumference      Peak Flow      Pain Score 0     Pain Loc      Pain Edu?      Excl. in [place]?     Physical Exam Vitals signs and nursing note reviewed.  Constitutional:      General: She is not in acute distress.    Appearance: She is well-developed. She is not ill-appearing.  HENT:     Head: Normocephalic and atraumatic.     Nose: Nose normal.     Mouth/Throat:     Mouth: Mucous membranes are moist.  Eyes:     Extraocular Movements: Extraocular movements intact.     Conjunctiva/sclera: Conjunctivae normal.     Pupils: Pupils are equal, round, and reactive to light.  Neck:     Musculoskeletal: Normal range of motion and neck supple.  Cardiovascular:     Rate and Rhythm: Normal rate and regular rhythm.     Pulses: Normal pulses.     Heart sounds: Normal heart sounds. No murmur.  Pulmonary:     Effort: Pulmonary effort is normal.     Comments: Coarse breath sounds Abdominal:     Palpations: Abdomen is soft.     Tenderness: There is no abdominal tenderness.  Skin:    General: Skin is warm and dry.     Capillary Refill: Capillary refill takes less than 2 seconds.  Neurological:     General: No focal deficit present.     Mental Status: She is alert.  Psychiatric:        Mood and Affect: Mood normal.      ED Treatments / Results  Labs (all labs ordered are listed, but only abnormal results are displayed) Labs Reviewed - No data to display  EKG None  Radiology Dg Chest Portable 1 View  Result Date: 02/26/2019 CLINICAL DATA:  Covid positive, shortness of breath EXAM: PORTABLE CHEST 1 VIEW  COMPARISON:  February 23, 2016 FINDINGS: The heart size and mediastinal contours are within normal limits. Both lungs are clear. The visualized skeletal structures are unremarkable. IMPRESSION: No active disease. Electronically Signed   By: Prudencio Pair M.D.   On:  02/26/2019 13:43    Procedures Procedures (including critical care time)  Medications Ordered in ED Medications - No data to display   Initial Impression / Assessment and Plan / ED Course  I have reviewed the triage vital signs and the nursing notes.  Pertinent labs & imaging results that were available during my care of the patient were reviewed by me and considered in my medical decision making (see chart for details).     Samantha Simpson is a 48 year old female with no significant medical history presents to the ED with nasal congestion, shortness of breath, cough.  Patient tested positive for coronavirus last week.  Patient with normal vitals.  No fever.  Normal pulse ox both with ambulation and at rest.  No tachycardia.  No signs of respiratory distress.  Coarse breath sounds but good air movement.  Patient with chest x-ray that showed no acute findings.  Patient states that cough and fever is improving.  However, has a lot of nasal congestion which she thinks is making it hard for her to breathe.  Does not have sore throat.  Overall she appears to be hemodynamically stable.  No signs of respiratory distress.  Does not get hypoxic.  Suspect that she has mostly nasal congestion and overall seems to be improving from an infectious standpoint.  Given strict return precautions including worsening shortness of breath especially if at rest.  She states that she does have a pulse ox and will continue to monitor as well.  Given discharge instructions and discharged in stable condition.  MARNIE FAZZINO was evaluated in Emergency Department on 02/26/2019 for the symptoms described in the history of present illness. She was evaluated in the  context of the global COVID-19 pandemic, which necessitated consideration that the patient might be at risk for infection with the SARS-CoV-2 virus that causes COVID-19. Institutional protocols and algorithms that pertain to the evaluation of patients at risk for COVID-19 are in a state of rapid change based on information released by regulatory bodies including the CDC and federal and state organizations. These policies and algorithms were followed during the patient's care in the ED.  This chart was dictated using voice recognition software.  Despite best efforts to proofread,  errors can occur which can change the documentation meaning.    Final Clinical Impressions(s) / ED Diagnoses   Final diagnoses:  COVID-19    ED Discharge Orders    None       Virgina Norfolk, DO 02/26/19 1427

## 2020-08-25 NOTE — Patient Instructions (Addendum)
Recommendations from today's visit:  We will start amlodipine 53m today. I would like you to measure your blood pressure at home once a day  Make sure you are relaxed, your feet are flat on the floor, and your arm is at the level of your heart when you measure.  We will follow-up in one week to see how your blood pressures are looking on this dose- we will likely need to increase this, but I don't want to drop your BP too quickly.   We can do this in person or as a virtual visit, if that is easier for you.  We will need to do your physical and labs, but I would like to get your BP down a little before we jump into that.   Information on diet, exercise, and health maintenance recommendations are listed below. This is information to help you be sure you are on track for optimal health and monitoring. Please look over this and let u[place]now if you have any questions or if you have completed any of the health maintenance outside of [place] that we can be sure your records are up to date.  ___________________________________________________________  [facility] I am excited for the opportunity to partner with you to meet your health care goals. It was a pleasure meeting you today!  I am an Adult-Geriatric Nurse Practitioner with a background in caring for patients for more than 20 years. I received my BPaediatric nursein Nursing and my Doctor of Nursing Practice degrees [facility] [employer] I received additional fellowship training in primary care and sports medicine after receiving my doctorate degree. I provide primary care and sports medicine services to patients age 2364and older within this office. I am also a provider [facility] the director of the APP [facility]  [place]  [place] years and am proud to be a member of this community.   I am passionate about providing the best service to you through preventive medicine and supportive care. I consider you a part of the medical team and value your input. I work diligently to ensure that you are heard and your needs are met in a safe and effective manner. I want you to feel comfortable with me as your provider and want you to know that your health concerns are important to me.   For your information, our office hours are Monday- Friday 8:00 AM - 5:00 PM At this time I am not in the office on Wednesdays.  If you have questions or concerns, please call our office at [phone] send u[place]aa MyChart message and we will respond as quickly as possible.   For all urgent or time sensitive needs we ask that you please call the office to avoid delays. MyChart is not constantly monitored and replies may take up to 72 business hours.  MyChart Policy: . MyChart allows for you to see your visit notes, after visit summary, provider recommendations, lab and tests results, make an appointment, request refills, and contact your provider or the office for non-urgent questions or concerns.  . Providers are seeing patients during normal business hours and do not have built in time to review MyChart messages. We ask that you allow a minimum of 72 business hours for MyChart message  responses.  . Complex MyChart concerns may require a visit. Your provider may request you schedule a virtual or in person visit to ensure we are providing the best care possible. . MyChart messages sent after 4:00 PM on Friday will not be received by the provider until Monday morning.    Lab and Test Results: . You will receive your lab and test results on MyChart as soon as they are completed and results have been sent by the lab or testing facility. Due to this service, you will receive your results BEFORE your provider.  . Please allow a minimum of 72 business  hours for your provider to receive and review lab and test results and contact you about.   . Most lab and test result comments from the provider will be sent through [place]. Your provider may recommend changes to the plan of care, follow-up visits, repeat testing, ask questions, or request an office visit to discuss these results. You may reply directly to this message or call the office at [phone] to provide information for the provider or set up an appointment. . In some instances, you will be called with test results and recommendations. Please let us know if this is preferred and we will make note of this in your chart to provide this for you.    . If you have not heard a response to your lab or test results in 72 business hours, please call the office to let us know.   After Hours: . For all non-emergency after hours needs, please call the office at [phone] and select the option to reach the on-call provider service. On-call services are shared between multiple [facility] offices and therefore it will not be possible to speak directly with your provider. On-call providers may provide medical advice and recommendations, but are unable to provide refills for maintenance medications.  . For all emergency or urgent medical needs after normal business hours, we recommend that you seek care at the closest Urgent Care or Emergency Department to ensure appropriate treatment in a timely manner.  [facility] has a 24 hour emergency room located on the ground floor for your convenience.    Please do not hesitate to reach out to [place] with concerns.   Thank you, again, for choosing me as your health care partner. I appreciate your trust and look forward to learning more about you.   Worthy Keeler, DNP, AGNP-c ___________________________________________________________  Health Maintenance Recommendations Screening Testing  Mammogram  Every 1 -2 years based on  history and risk factors  Starting at age 32  Pap Smear  Ages 21-39 every 3 years  Ages 78-65 every 5 years with HPV testing  More frequent testing may be required based on results and history  Colon Cancer Screening  Every 1-10 years based on test performed, risk factors, and history  Starting at age 37  Bone Density Screening  Every 2-10 years based on history  Starting at age 9 for women  Recommendations for men differ based on medication usage, history, and risk factors  AAA Screening  One time ultrasound  Men 61-42 years old who have every smoked  Lung Cancer Screening  Low Dose Lung CT every 12 months  Age 78-80 years with a 30 pack-year smoking history who still smoke or who have quit within the last 15 years  Screening Labs  Routine  Labs: Complete Blood Count (CBC), Complete Metabolic Panel (CMP), Cholesterol (Lipid Panel)  Every 6-12 months  based on history and medications  May be recommended more frequently based on current conditions or previous results  Hemoglobin A1c Lab  Every 3-12 months based on history and previous results  Starting at age 14 or earlier with diagnosis of diabetes, high cholesterol, BMI >26, and/or risk factors  Frequent monitoring for patients with diabetes to ensure blood sugar control  Thyroid Panel (TSH w/ T3 & T4)  Every 6 months based on history, symptoms, and risk factors  May be repeated more often if on medication  HIV  One time testing for all patients 29 and older  May be repeated more frequently for patients with increased risk factors or exposure  Hepatitis C  One time testing for all patients 71 and older  May be repeated more frequently for patients with increased risk factors or exposure  Gonorrhea, Chlamydia  Every 12 months for all sexually active persons 13-24 years  Additional monitoring may be recommended for those who are considered high risk or who have symptoms  PSA  Men 23-61  years old with risk factors  Additional screening may be recommended from age 78-69 based on risk factors, symptoms, and history  Vaccine Recommendations  Tetanus Booster  All adults every 10 years  Flu Vaccine  All patients 6 months and older every year  COVID Vaccine  All patients 12 years and older  Initial dosing with booster  May recommend additional booster based on age and health history  HPV Vaccine  2 doses all patients age 58-26  Dosing may be considered for patients over 26  Shingles Vaccine (Shingrix)  2 doses all adults 8 years and older  Pneumonia (Pneumovax 23)  All adults 86 years and older  May recommend earlier dosing based on health history  Pneumonia (Prevnar 60)  All adults 56 years and older  Dosed 1 year after Pneumovax 23  Additional Screening, Testing, and Vaccinations may be recommended on an individualized basis based on family history, health history, risk factors, and/or exposure.  __________________________________________________________  Diet Recommendations for All Patients  I recommend that all patients maintain a diet low in saturated fats, carbohydrates, and cholesterol. While this can be challenging at first, it is not impossible and small changes can make big differences.  Things to try: Marland Kitchen Decreasing the amount of soda, sweet tea, and/or juice to one or less per day and replace with water o While water is always the first choice, if you do not like water you may consider - adding a water additive without sugar to improve the taste - other sugar free drinks . Replace potatoes with a brightly colored vegetable at dinner . Use healthy oils, such as canola oil or olive oil, instead of butter or hard margarine . Limit your bread intake to two pieces or less a day . Replace regular pasta with low carb pasta options . Bake, broil, or grill foods instead of frying . Monitor portion sizes  . Eat smaller, more frequent meals  throughout the day instead of large meals  An important thing to remember is, if you love foods that are not great for your health, you don't have to give them up completely. Instead, allow these foods to be a reward when you have done well. Allowing yourself to still have special treats every once in a while is a nice way to tell yourself thank you for working hard to keep yourself healthy.   Also remember that every day is a new day. If you have  a bad day and "fall off the wagon", you can still climb right back up and keep moving along on your journey!  We have resources available to help you!  Some websites that may be helpful include: . www.http://carter.biz/  . Www.VeryWellFit.com _____________________________________________________________  Activity Recommendations for All Patients  I recommend that all adults get at least 20 minutes of moderate physical activity that elevates your heart rate at least 5 days out of the week.  Some examples include: . Walking or jogging at a pace that allows you to carry on a conversation . Cycling (stationary bike or outdoors) . Water aerobics . Yoga . Weight lifting . Dancing If physical limitations prevent you from putting stress on your joints, exercise in a pool or seated in a chair are excellent options.  Do determine your MAXIMUM heart rate for activity: YOUR AGE - 220 = MAX HeartRate   Remember! . Do not push yourself too hard.  . Start slowly and build up your pace, speed, weight, time in exercise, etc.  . Allow your body to rest between exercise and get good sleep. . You will need more water than normal when you are exerting yourself. Do not wait until you are thirsty to drink. Drink with a purpose of getting in at least 8, 8 ounce glasses of water a day plus more depending on how much you exercise and sweat.    If you begin to develop dizziness, chest pain, abdominal pain, jaw pain, shortness of breath, headache, vision changes,  lightheadedness, or other concerning symptoms, stop the activity and allow your body to rest. If your symptoms are severe, seek emergency evaluation immediately. If your symptoms are concerning, but not severe, please let us know so that we can recommend further evaluation.   ________________________________________________________________    Hypertension, Adult High blood pressure (hypertension) is when the force of blood pumping through the arteries is too strong. The arteries are the blood vessels that carry blood from the heart throughout the body. Hypertension forces the heart to work harder to pump blood and may cause arteries to become narrow or stiff. Untreated or uncontrolled hypertension can cause a heart attack, heart failure, a stroke, kidney disease, and other problems. A blood pressure reading consists of a higher number over a lower number. Ideally, your blood pressure should be below 120/80. The first ("top") number is called the systolic pressure. It is a measure of the pressure in your arteries as your heart beats. The second ("bottom") number is called the diastolic pressure. It is a measure of the pressure in your arteries as the heart relaxes. What are the causes? The exact cause of this condition is not known. There are some conditions that result in or are related to high blood pressure. What increases the risk? Some risk factors for high blood pressure are under your control. The following factors may make you more likely to develop this condition:  Smoking.  Having type 2 diabetes mellitus, high cholesterol, or both.  Not getting enough exercise or physical activity.  Being overweight.  Having too much fat, sugar, calories, or salt (sodium) in your diet.  Drinking too much alcohol. Some risk factors for high blood pressure may be difficult or impossible to change. Some of these factors include:  Having chronic kidney disease.  Having a family history of high  blood pressure.  Age. Risk increases with age.  Race. You may be at higher risk if you are African American.  Gender. Men are at  higher risk than women before age 53. After age 13, women are at higher risk than men.  Having obstructive sleep apnea.  Stress. What are the signs or symptoms? High blood pressure may not cause symptoms. Very high blood pressure (hypertensive crisis) may cause:  Headache.  Anxiety.  Shortness of breath.  Nosebleed.  Nausea and vomiting.  Vision changes.  Severe chest pain.  Seizures. How is this diagnosed? This condition is diagnosed by measuring your blood pressure while you are seated, with your arm resting on a flat surface, your legs uncrossed, and your feet flat on the floor. The cuff of the blood pressure monitor will be placed directly against the skin of your upper arm at the level of your heart. It should be measured at least twice using the same arm. Certain conditions can cause a difference in blood pressure between your right and left arms. Certain factors can cause blood pressure readings to be lower or higher than normal for a short period of time:  When your blood pressure is higher when you are in a health care provider's office than when you are at home, this is called white coat hypertension. Most people with this condition do not need medicines.  When your blood pressure is higher at home than when you are in a health care provider's office, this is called masked hypertension. Most people with this condition may need medicines to control blood pressure. If you have a high blood pressure reading during one visit or you have normal blood pressure with other risk factors, you may be asked to:  Return on a different day to have your blood pressure checked again.  Monitor your blood pressure at home for 1 week or longer. If you are diagnosed with hypertension, you may have other blood or imaging tests to help your health care provider  understand your overall risk for other conditions. How is this treated? This condition is treated by making healthy lifestyle changes, such as eating healthy foods, exercising more, and reducing your alcohol intake. Your health care provider may prescribe medicine if lifestyle changes are not enough to get your blood pressure under control, and if:  Your systolic blood pressure is above 130.  Your diastolic blood pressure is above 80. Your personal target blood pressure may vary depending on your medical conditions, your age, and other factors. Follow these instructions at home: Eating and drinking  Eat a diet that is high in fiber and potassium, and low in sodium, added sugar, and fat. An example eating plan is called the DASH (Dietary Approaches to Stop Hypertension) diet. To eat this way: ? Eat plenty of fresh fruits and vegetables. Try to fill one half of your plate at each meal with fruits and vegetables. ? Eat whole grains, such as whole-wheat pasta, brown rice, or whole-grain bread. Fill about one fourth of your plate with whole grains. ? Eat or drink low-fat dairy products, such as skim milk or low-fat yogurt. ? Avoid fatty cuts of meat, processed or cured meats, and poultry with skin. Fill about one fourth of your plate with lean proteins, such as fish, chicken without skin, beans, eggs, or tofu. ? Avoid pre-made and processed foods. These tend to be higher in sodium, added sugar, and fat.  Reduce your daily sodium intake. Most people with hypertension should eat less than 1,500 mg of sodium a day.  Do not drink alcohol if: ? Your health care provider tells you not to drink. ? You are  pregnant, may be pregnant, or are planning to become pregnant.  If you drink alcohol: ? Limit how much you use to:  0-1 drink a day for women.  0-2 drinks a day for men. ? Be aware of how much alcohol is in your drink. In the [place] one drink equals one 12 oz bottle of beer (355 mL), one 5 oz glass  of wine (148 mL), or one 1 oz glass of hard liquor (44 mL).   Lifestyle  Work with your health care provider to maintain a healthy body weight or to lose weight. Ask what an ideal weight is for you.  Get at least 30 minutes of exercise most days of the week. Activities may include walking, swimming, or biking.  Include exercise to strengthen your muscles (resistance exercise), such as Pilates or lifting weights, as part of your weekly exercise routine. Try to do these types of exercises for 30 minutes at least 3 days a week.  Do not use any products that contain nicotine or tobacco, such as cigarettes, e-cigarettes, and chewing tobacco. If you need help quitting, ask your health care provider.  Monitor your blood pressure at home as told by your health care provider.  Keep all follow-up visits as told by your health care provider. This is important.   Medicines  Take over-the-counter and prescription medicines only as told by your health care provider. Follow directions carefully. Blood pressure medicines must be taken as prescribed.  Do not skip doses of blood pressure medicine. Doing this puts you at risk for problems and can make the medicine less effective.  Ask your health care provider about side effects or reactions to medicines that you should watch for. Contact a health care provider if you:  Think you are having a reaction to a medicine you are taking.  Have headaches that keep coming back (recurring).  Feel dizzy.  Have swelling in your ankles.  Have trouble with your vision. Get help right away if you:  Develop a severe headache or confusion.  Have unusual weakness or numbness.  Feel faint.  Have severe pain in your chest or abdomen.  Vomit repeatedly.  Have trouble breathing. Summary  Hypertension is when the force of blood pumping through your arteries is too strong. If this condition is not controlled, it may put you at risk for serious  complications.  Your personal target blood pressure may vary depending on your medical conditions, your age, and other factors. For most people, a normal blood pressure is less than 120/80.  Hypertension is treated with lifestyle changes, medicines, or a combination of both. Lifestyle changes include losing weight, eating a healthy, low-sodium diet, exercising more, and limiting alcohol. This information is not intended to replace advice given to you by your health care provider. Make sure you discuss any questions you have with your health care provider. Document Revised: 12/05/2017 Document Reviewed: 12/05/2017 Elsevier Patient Education  2021 [place].   [email or web address]">  DASH Eating Plan DASH stands for Dietary Approaches to Stop Hypertension. The DASH eating plan is a healthy eating plan that has been shown to:  Reduce high blood pressure (hypertension).  Reduce your risk for type 2 diabetes, heart disease, and stroke.  Help with weight loss. What are tips for following this plan? Reading food labels  Check food labels for the amount of salt (sodium) per serving. Choose foods with less than 5 percent of the Daily Value of sodium. Generally, foods with less than 300 milligrams (mg)  of sodium per serving fit into this eating plan.  To find whole grains, look for the word "whole" as the first word in the ingredient list. Shopping  Buy products labeled as "low-sodium" or "no salt added."  Buy fresh foods. Avoid canned foods and pre-made or frozen meals. Cooking  Avoid adding salt when cooking. Use salt-free seasonings or herbs instead of table salt or sea salt. Check with your health care provider or pharmacist before using salt substitutes.  Do not fry foods. Cook foods using healthy methods such as baking, boiling, grilling, roasting, and broiling instead.  Cook with heart-healthy oils, such as olive, canola, avocado,  soybean, or sunflower oil. Meal planning  Eat a balanced diet that includes: ? 4 or more servings of fruits and 4 or more servings of vegetables each day. Try to fill one-half of your plate with fruits and vegetables. ? 6-8 servings of whole grains each day. ? Less than 6 oz (170 g) of lean meat, poultry, or fish each day. A 3-oz (85-g) serving of meat is about the same size as a deck of cards. One egg equals 1 oz (28 g). ? 2-3 servings of low-fat dairy each day. One serving is 1 cup (237 mL). ? 1 serving of nuts, seeds, or beans 5 times each week. ? 2-3 servings of heart-healthy fats. Healthy fats called omega-3 fatty acids are found in foods such as walnuts, flaxseeds, fortified milks, and eggs. These fats are also found in cold-water fish, such as sardines, salmon, and mackerel.  Limit how much you eat of: ? Canned or prepackaged foods. ? Food that is high in trans fat, such as some fried foods. ? Food that is high in saturated fat, such as fatty meat. ? Desserts and other sweets, sugary drinks, and other foods with added sugar. ? Full-fat dairy products.  Do not salt foods before eating.  Do not eat more than 4 egg yolks a week.  Try to eat at least 2 vegetarian meals a week.  Eat more home-cooked food and less restaurant, buffet, and fast food.   Lifestyle  When eating at a restaurant, ask that your food be prepared with less salt or no salt, if possible.  If you drink alcohol: ? Limit how much you use to:  0-1 drink a day for women who are not pregnant.  0-2 drinks a day for men. ? Be aware of how much alcohol is in your drink. In the [place] one drink equals one 12 oz bottle of beer (355 mL), one 5 oz glass of wine (148 mL), or one 1 oz glass of hard liquor (44 mL). General information  Avoid eating more than 2,300 mg of salt a day. If you have hypertension, you may need to reduce your sodium intake to 1,500 mg a day.  Work with your health care provider to maintain a  healthy body weight or to lose weight. Ask what an ideal weight is for you.  Get at least 30 minutes of exercise that causes your heart to beat faster (aerobic exercise) most days of the week. Activities may include walking, swimming, or biking.  Work with your health care provider or dietitian to adjust your eating plan to your individual calorie needs. What foods should I eat? Fruits All fresh, dried, or frozen fruit. Canned fruit in natural juice (without added sugar). Vegetables Fresh or frozen vegetables (raw, steamed, roasted, or grilled). Low-sodium or reduced-sodium tomato and vegetable juice. Low-sodium or reduced-sodium tomato sauce and  tomato paste. Low-sodium or reduced-sodium canned vegetables. Grains Whole-grain or whole-wheat bread. Whole-grain or whole-wheat pasta. Brown rice. [place]. Bulgur. Whole-grain and low-sodium cereals. Pita bread. Low-fat, low-sodium crackers. Whole-wheat flour tortillas. Meats and other proteins Skinless chicken or [place]. Ground chicken or [place]. Pork with fat trimmed off. Fish and seafood. Egg whites. Dried beans, peas, or lentils. Unsalted nuts, nut butters, and seeds. Unsalted canned beans. Lean cuts of beef with fat trimmed off. Low-sodium, lean precooked or cured meat, such as sausages or meat loaves. Dairy Low-fat (1%) or fat-free (skim) milk. Reduced-fat, low-fat, or fat-free cheeses. Nonfat, low-sodium ricotta or cottage cheese. Low-fat or nonfat yogurt. Low-fat, low-sodium cheese. Fats and oils Soft margarine without trans fats. Vegetable oil. Reduced-fat, low-fat, or light mayonnaise and salad dressings (reduced-sodium). Canola, safflower, olive, avocado, soybean, and sunflower oils. Avocado. Seasonings and condiments Herbs. Spices. Seasoning mixes without salt. Other foods Unsalted popcorn and pretzels. Fat-free sweets. The items listed above may not be a complete list of foods and beverages you can eat. Contact a dietitian for  more information. What foods should I avoid? Fruits Canned fruit in a light or heavy syrup. Fried fruit. Fruit in cream or butter sauce. Vegetables Creamed or fried vegetables. Vegetables in a cheese sauce. Regular canned vegetables (not low-sodium or reduced-sodium). Regular canned tomato sauce and paste (not low-sodium or reduced-sodium). Regular tomato and vegetable juice (not low-sodium or reduced-sodium). Angie Fava. Olives. Grains Baked goods made with fat, such as croissants, muffins, or some breads. Dry pasta or rice meal packs. Meats and other proteins Fatty cuts of meat. Ribs. Fried meat. Berniece Salines. Bologna, salami, and other precooked or cured meats, such as sausages or meat loaves. Fat from the back of a pig (fatback). Bratwurst. Salted nuts and seeds. Canned beans with added salt. Canned or smoked fish. Whole eggs or egg yolks. Chicken or [place] with skin. Dairy Whole or 2% milk, cream, and half-and-half. Whole or full-fat cream cheese. Whole-fat or sweetened yogurt. Full-fat cheese. Nondairy creamers. Whipped toppings. Processed cheese and cheese spreads. Fats and oils Butter. Stick margarine. Lard. Shortening. Ghee. Bacon fat. Tropical oils, such as coconut, palm kernel, or palm oil. Seasonings and condiments Onion salt, garlic salt, seasoned salt, table salt, and sea salt. Worcestershire sauce. Tartar sauce. Barbecue sauce. Teriyaki sauce. Soy sauce, including reduced-sodium. Steak sauce. Canned and packaged gravies. Fish sauce. Oyster sauce. Cocktail sauce. Store-bought horseradish. Ketchup. Mustard. Meat flavorings and tenderizers. Bouillon cubes. Hot sauces. Pre-made or packaged marinades. Pre-made or packaged taco seasonings. Relishes. Regular salad dressings. Other foods Salted popcorn and pretzels. The items listed above may not be a complete list of foods and beverages you should avoid. Contact a dietitian for more information. Where to find more information  National Heart,  Lung, and Blood Institute: https://wilson-eaton.com/  American Heart Association: www.heart.org  Academy of Nutrition and Dietetics: www.eatright.[place]: www.kidney.org Summary  The DASH eating plan is a healthy eating plan that has been shown to reduce high blood pressure (hypertension). It may also reduce your risk for type 2 diabetes, heart disease, and stroke.  When on the DASH eating plan, aim to eat more fresh fruits and vegetables, whole grains, lean proteins, low-fat dairy, and heart-healthy fats.  With the DASH eating plan, you should limit salt (sodium) intake to 2,300 mg a day. If you have hypertension, you may need to reduce your sodium intake to 1,500 mg a day.  Work with your health care provider or dietitian to adjust your eating plan to  your individual calorie needs. This information is not intended to replace advice given to you by your health care provider. Make sure you discuss any questions you have with your health care provider. Document Revised: 02/28/2019 Document Reviewed: 02/28/2019 Elsevier Patient Education  2021 [employer].

## 2020-08-25 NOTE — Progress Notes (Signed)
Shawna Clamp, DNP, AGNP-c Primary Care Services ______________________________________________________________________________________________________________________________________________  HPI Samantha Simpson is a 50 y.o. year old female presenting to [facility] today to establish care.  Other providers seen: [facility]  Concerns today: . BP  In office today is 190/116 with first measurement. Was seen by her eye doctor in April and was told BP was elevated and she needed to have this checked into. BP was also elevated wen she went for COVID testing in January. Endorses significant changes to her vision in 2 years. Has had COVID three times since beginning of pandemic with most recent diagnosis in January.  Endorses intermittent headaches without dizziness, weakness, numbness, tingling, or palpitations. No vision changes during periods of headaches.    Narrative: Samantha Simpson is MARITAL STATUS: Married to [employer] She has 2 children. both currently living at home. She reports she is safe in his current relationships and home environment.  She does not have a history or partner abuse.  She is currently currently employed.  She endorses normal activities of daily living, walking the dogs 4 times a day, uses air fryer, no salt, eats fruit and vegetables, likes cookies.   She denies nicotine use, deniesdenied., denies denied. Sexually active:  yes, with males- one partner  She reports last menstrual period 05/2020- irregular with missed periods starting last year- only one epicose of light spotting this year She is not planning pregnancy in the near future.  Contraceptive options include condoms most of the time She reports STI history of STDs: none and denies concerns for STI today.  She denies recent changes to bowel habits, denies recent changes to bladder habits, denies recent  changes to skin.  She denies recent mood related changes. PHQ and GAD listed below.  PHQ9 Today: Depression screen [facility] 2/9 08/26/2020 06/08/2016  Decreased Interest 1 0  Down, Depressed, Hopeless 0 0  PHQ - 2 Score 1 0  Altered sleeping 2 -  Tired, decreased energy 3 -  Change in appetite 2 -  Trouble concentrating 0 -  Moving slowly or fidgety/restless 0 -  Suicidal thoughts 0 -  PHQ-9 Score 8 -  Difficult doing work/chores Not difficult at all -   GAD7 Today: GAD 7 : Generalized Anxiety Score 08/26/2020  Nervous, Anxious, on Edge 0  Control/stop worrying 1  Worry too much - different things 1  Trouble relaxing 1  Restless 0  Easily annoyed or irritable 1  Afraid - awful might happen 0  Total GAD 7 Score 4  Anxiety Difficulty Not difficult at all    Health Maintenance Due  Topic Date Due  . HIV Screening  Never done  . Hepatitis C Screening  Never done  . TETANUS/TDAP  Never done  . PAP SMEAR-Modifier  10/19/2014  . COLONOSCOPY (Pts 45-60yrs Insurance coverage will need to be confirmed)  Never done  . MAMMOGRAM  02/23/2020  . COVID-19 Vaccine (3 - Booster for Moderna series) 03/25/2020     PMH Past Medical History:  Diagnosis Date  . ASCUS (atypical squamous cells of undetermined significance) on Pap smear 1997  . BV (bacterial vaginosis) 2001  . CIN I (cervical intraepithelial neoplasia I) 2004  . Exposure to chlamydia, mucopurulent cervitis/nongonococcal urethritis 1997  . H/O fatigue 2010  . H/O: obesity 1997  . HGSIL (high grade squamous intraepithelial dysplasia)   . Increased BMI 2006  . Ovarian cyst, left 1996  . Renal  stones     ROS All review of systems negative except what is listed in the HPI  PHYSICAL EXAM General Appearance:  awake, alert, oriented, in no acute distress, well developed, well nourished and obese Skin:  there are no suspicious lesions or rashes of concern Head/face:  NCAT Eyes:  No gross abnormalities., PERRL, EOMI and Sclera  nonicteric Ears:  canals and TMs NI Neck:  neck- supple, no mass, non-tender, no bruits and no jvd Lungs:  Normal expansion.  Clear to auscultation.  No rales, rhonchi, or wheezing., No chest wall tenderness., No kyphosis or scoliosis. Heart:  Heart sounds are normal.  Regular rate and rhythm without murmur, gallop or rub. Neurologic:  Alert and oriented x 3, gait normal., reflexes normal and symmetric, strength and  sensation grossly normal Psych exam:alert,oriented, in NAD with a full range of affect, normal behavior and no psychotic features  ASSESSMENT AND PLAN Problem List Items Addressed This Visit    Encounter to establish care - Primary    Review of current and past medical history, social history, medication, and family history.  Review of care gaps and health maintenance recommendations.  Records from recent providers to be requested if not available in Chart Review or Care Everywhere.  Recommendations for health maintenance, diet, and exercise provided.  BP significantly elevated in office today- will work to manage uncontrolled HTN and then plan for CPE.  Pt agreeable to plan F/U in 1 week for BP       Hypertension    Assessment: Significantly elevated BP in office today with no alarm symptoms or abnormal findings noted. Recent elevations in BP at [facility] doctor prompted pt seeking care Intermittent HA reported with no associated symptoms. Hx COVID x3 with most recent in January Discussion/Recommendations: Blood pressure is severely elevated Prompt management required to avoid damage to organs and reduce risk of CV event Clonidine 0.2mg  given in office today- BP improved Start amlodipine 5mg  daily. Monitor BP at home daily Instructions discussed and provided on handout for BP monitoring Instructions for BP management provided on handout Seek emergency care if chest pain, palpitations, vision changes, weakness, slurred speech, or other alarming symptoms occur.   Plan: Follow-up in 1 week with virtual visit for assessment of medications Will likely need increased dosage, but do not want to drop BP too quickly given she has likely lived in this level for some time.  F/U sooner if any symptoms develop       Relevant Medications   amLODipine (NORVASC) 5 MG tablet   cloNIDine (CATAPRES) tablet 0.2 mg    Other Visit Diagnoses    Body mass index (BMI) of 45.0-49.9 in adult [facility])          Education provided today during visit and on AVS for patient to review at home.  Diet and Exercise recommendations provided.  Current diagnoses and recommendations discussed. HM recommendations reviewed with recommendations.    Outpatient Encounter Medications as of 08/26/2020  Medication Sig  . amLODipine (NORVASC) 5 MG tablet Take 1 tablet (5 mg total) by mouth daily.  . [DISCONTINUED] azithromycin (ZITHROMAX) 250 MG tablet Sig as indicated  . [DISCONTINUED] HYDROcodone-acetaminophen (NORCO/VICODIN) 5-325 MG tablet Take 1-2 tablets by mouth every 6 (six) hours as needed.  . [DISCONTINUED] HYDROcodone-acetaminophen (NORCO/VICODIN) 5-325 MG tablet Take 1-2 tablets by mouth every 6 (six) hours as needed.  . [DISCONTINUED] ondansetron (ZOFRAN) 4 MG tablet Take 1 tablet (4 mg total) by mouth every 6 (six) hours.  . DENTA 5000  PLUS 1.1 % CREA dental cream Take by mouth.   Facility-Administered Encounter Medications as of 08/26/2020  Medication  . cloNIDine (CATAPRES) tablet 0.2 mg    Return in 1 week (on 09/02/2020) for bp check-virtual.  Time: 60 minutes, >50% spent counseling, care coordination, chart review, and documentation.   Tollie Eth, DNP, AGNP-c

## 2020-08-26 ENCOUNTER — Other Ambulatory Visit: Payer: Self-pay

## 2020-08-26 ENCOUNTER — Ambulatory Visit (HOSPITAL_BASED_OUTPATIENT_CLINIC_OR_DEPARTMENT_OTHER): Payer: 59 | Admitting: Nurse Practitioner

## 2020-08-26 ENCOUNTER — Encounter (HOSPITAL_BASED_OUTPATIENT_CLINIC_OR_DEPARTMENT_OTHER): Payer: Self-pay | Admitting: Nurse Practitioner

## 2020-08-26 VITALS — BP 178/114 | HR 98 | Ht 65.0 in | Wt 293.2 lb

## 2020-08-26 DIAGNOSIS — Z6841 Body Mass Index (BMI) 40.0 and over, adult: Secondary | ICD-10-CM | POA: Diagnosis not present

## 2020-08-26 DIAGNOSIS — Z7689 Persons encountering health services in other specified circumstances: Secondary | ICD-10-CM

## 2020-08-26 DIAGNOSIS — I10 Essential (primary) hypertension: Secondary | ICD-10-CM

## 2020-08-26 HISTORY — DX: Essential (primary) hypertension: I10

## 2020-08-26 MED ORDER — AMLODIPINE BESYLATE 5 MG PO TABS: 5.0000 mg | ORAL_TABLET | Freq: Every day | ORAL | 2 refills | Status: DC

## 2020-08-26 MED ORDER — CLONIDINE HCL 0.2 MG PO TABS: 0.2000 mg | ORAL_TABLET | Freq: Once | ORAL | Status: DC

## 2020-08-26 NOTE — Assessment & Plan Note (Signed)
Review of current and past medical history, social history, medication, and family history.  Review of care gaps and health maintenance recommendations.  Records from recent providers to be requested if not available in Chart Review or Care Everywhere.  Recommendations for health maintenance, diet, and exercise provided.  BP significantly elevated in office today- will work to manage uncontrolled HTN and then plan for CPE.  Pt agreeable to plan F/U in 1 week for BP

## 2020-08-26 NOTE — Assessment & Plan Note (Signed)
Assessment: Significantly elevated BP in office today with no alarm symptoms or abnormal findings noted. Recent elevations in BP at [facility] doctor prompted pt seeking care Intermittent HA reported with no associated symptoms. Hx COVID x3 with most recent in January Discussion/Recommendations: Blood pressure is severely elevated Prompt management required to avoid damage to organs and reduce risk of CV event Clonidine 0.2mg  given in office today- BP improved Start amlodipine 5mg  daily. Monitor BP at home daily Instructions discussed and provided on handout for BP monitoring Instructions for BP management provided on handout Seek emergency care if chest pain, palpitations, vision changes, weakness, slurred speech, or other alarming symptoms occur.  Plan: Follow-up in 1 week with virtual visit for assessment of medications Will likely need increased dosage, but do not want to drop BP too quickly given she has likely lived in this level for some time.  F/U sooner if any symptoms develop

## 2020-09-02 ENCOUNTER — Encounter (HOSPITAL_BASED_OUTPATIENT_CLINIC_OR_DEPARTMENT_OTHER): Payer: Self-pay | Admitting: Nurse Practitioner

## 2020-09-02 ENCOUNTER — Telehealth (INDEPENDENT_AMBULATORY_CARE_PROVIDER_SITE_OTHER): Payer: 59 | Admitting: Nurse Practitioner

## 2020-09-02 ENCOUNTER — Other Ambulatory Visit: Payer: Self-pay

## 2020-09-02 VITALS — BP 172/105

## 2020-09-02 DIAGNOSIS — I10 Essential (primary) hypertension: Secondary | ICD-10-CM

## 2020-09-02 MED ORDER — AMLODIPINE BESYLATE 5 MG PO TABS: 10.0000 mg | ORAL_TABLET | Freq: Every day | ORAL | 2 refills | Status: DC

## 2020-09-02 NOTE — Patient Instructions (Addendum)
Assessment: BP improving with Amlodipine 5mg  No reported SE of medication No hypotension or edema Discussion/Recommendations: Increase medication to Amlodipine 10mg  per day Continue with diet and exercise habits Continue to monitor BP daily Monitor for side effects or blood pressures lower than 100/50- hold medication if time for next dose and report immediately  Report any changes or new symptoms  Seek emergency care for chest pain, shortness of breath, dizziness, weakness, difficulty speaking, or confusion. Plan: Amlodipine 10mg  daily Goal BP between 105-129/60 Send BP readings through MyChart in 1 week for follow-up Contact me sooner if needed.    Hypertension, Adult High blood pressure (hypertension) is when the force of blood pumping through the arteries is too strong. The arteries are the blood vessels that carry blood from the heart throughout the body. Hypertension forces the heart to work harder to pump blood and may cause arteries to become narrow or stiff. Untreated or uncontrolled hypertension can cause a heart attack, heart failure, a stroke, kidney disease, and other problems. A blood pressure reading consists of a higher number over a lower number. Ideally, your blood pressure should be below 120/80. The first ("top") number is called the systolic pressure. It is a measure of the pressure in your arteries as your heart beats. The second ("bottom") number is called the diastolic pressure. It is a measure of the pressure in your arteries as the heart relaxes. What are the causes? The exact cause of this condition is not known. There are some conditions that result in or are related to high blood pressure. What increases the risk? Some risk factors for high blood pressure are under your control. The following factors may make you more likely to develop this condition:  Smoking.  Having type 2 diabetes mellitus, high cholesterol, or both.  Not getting enough exercise or  physical activity.  Being overweight.  Having too much fat, sugar, calories, or salt (sodium) in your diet.  Drinking too much alcohol. Some risk factors for high blood pressure may be difficult or impossible to change. Some of these factors include:  Having chronic kidney disease.  Having a family history of high blood pressure.  Age. Risk increases with age.  Race. You may be at higher risk if you are African American.  Gender. Men are at higher risk than women before age 78. After age 59, women are at higher risk than men.  Having obstructive sleep apnea.  Stress. What are the signs or symptoms? High blood pressure may not cause symptoms. Very high blood pressure (hypertensive crisis) may cause:  Headache.  Anxiety.  Shortness of breath.  Nosebleed.  Nausea and vomiting.  Vision changes.  Severe chest pain.  Seizures. How is this diagnosed? This condition is diagnosed by measuring your blood pressure while you are seated, with your arm resting on a flat surface, your legs uncrossed, and your feet flat on the floor. The cuff of the blood pressure monitor will be placed directly against the skin of your upper arm at the level of your heart. It should be measured at least twice using the same arm. Certain conditions can cause a difference in blood pressure between your right and left arms. Certain factors can cause blood pressure readings to be lower or higher than normal for a short period of time:  When your blood pressure is higher when you are in a health care provider's office than when you are at home, this is called white coat hypertension. Most people with this condition  do not need medicines.  When your blood pressure is higher at home than when you are in a health care provider's office, this is called masked hypertension. Most people with this condition may need medicines to control blood pressure. If you have a high blood pressure reading during one visit or  you have normal blood pressure with other risk factors, you may be asked to:  Return on a different day to have your blood pressure checked again.  Monitor your blood pressure at home for 1 week or longer. If you are diagnosed with hypertension, you may have other blood or imaging tests to help your health care provider understand your overall risk for other conditions. How is this treated? This condition is treated by making healthy lifestyle changes, such as eating healthy foods, exercising more, and reducing your alcohol intake. Your health care provider may prescribe medicine if lifestyle changes are not enough to get your blood pressure under control, and if:  Your systolic blood pressure is above 130.  Your diastolic blood pressure is above 80. Your personal target blood pressure may vary depending on your medical conditions, your age, and other factors. Follow these instructions at home: Eating and drinking  Eat a diet that is high in fiber and potassium, and low in sodium, added sugar, and fat. An example eating plan is called the DASH (Dietary Approaches to Stop Hypertension) diet. To eat this way: ? Eat plenty of fresh fruits and vegetables. Try to fill one half of your plate at each meal with fruits and vegetables. ? Eat whole grains, such as whole-wheat pasta, brown rice, or whole-grain bread. Fill about one fourth of your plate with whole grains. ? Eat or drink low-fat dairy products, such as skim milk or low-fat yogurt. ? Avoid fatty cuts of meat, processed or cured meats, and poultry with skin. Fill about one fourth of your plate with lean proteins, such as fish, chicken without skin, beans, eggs, or tofu. ? Avoid pre-made and processed foods. These tend to be higher in sodium, added sugar, and fat.  Reduce your daily sodium intake. Most people with hypertension should eat less than 1,500 mg of sodium a day.  Do not drink alcohol if: ? Your health care provider tells you not  to drink. ? You are pregnant, may be pregnant, or are planning to become pregnant.  If you drink alcohol: ? Limit how much you use to:  0-1 drink a day for women.  0-2 drinks a day for men. ? Be aware of how much alcohol is in your drink. In the [place] one drink equals one 12 oz bottle of beer (355 mL), one 5 oz glass of wine (148 mL), or one 1 oz glass of hard liquor (44 mL).   Lifestyle  Work with your health care provider to maintain a healthy body weight or to lose weight. Ask what an ideal weight is for you.  Get at least 30 minutes of exercise most days of the week. Activities may include walking, swimming, or biking.  Include exercise to strengthen your muscles (resistance exercise), such as Pilates or lifting weights, as part of your weekly exercise routine. Try to do these types of exercises for 30 minutes at least 3 days a week.  Do not use any products that contain nicotine or tobacco, such as cigarettes, e-cigarettes, and chewing tobacco. If you need help quitting, ask your health care provider.  Monitor your blood pressure at home as told by your health  care provider.  Keep all follow-up visits as told by your health care provider. This is important.   Medicines  Take over-the-counter and prescription medicines only as told by your health care provider. Follow directions carefully. Blood pressure medicines must be taken as prescribed.  Do not skip doses of blood pressure medicine. Doing this puts you at risk for problems and can make the medicine less effective.  Ask your health care provider about side effects or reactions to medicines that you should watch for. Contact a health care provider if you:  Think you are having a reaction to a medicine you are taking.  Have headaches that keep coming back (recurring).  Feel dizzy.  Have swelling in your ankles.  Have trouble with your vision. Get help right away if you:  Develop a severe headache or  confusion.  Have unusual weakness or numbness.  Feel faint.  Have severe pain in your chest or abdomen.  Vomit repeatedly.  Have trouble breathing. Summary  Hypertension is when the force of blood pumping through your arteries is too strong. If this condition is not controlled, it may put you at risk for serious complications.  Your personal target blood pressure may vary depending on your medical conditions, your age, and other factors. For most people, a normal blood pressure is less than 120/80.  Hypertension is treated with lifestyle changes, medicines, or a combination of both. Lifestyle changes include losing weight, eating a healthy, low-sodium diet, exercising more, and limiting alcohol. This information is not intended to replace advice given to you by your health care provider. Make sure you discuss any questions you have with your health care provider. Document Revised: 12/05/2017 Document Reviewed: 12/05/2017 Elsevier Patient Education  2021 [employer].

## 2020-09-02 NOTE — Assessment & Plan Note (Signed)
Assessment: BP improving with Amlodipine 5mg  No reported SE of medication No hypotension or edema Discussion/Recommendations: Increase medication to Amlodipine 10mg  per day Continue with diet and exercise habits Continue to monitor BP daily Monitor for side effects or blood pressures lower than 100/50- report Report any changes or new symptoms immediately Plan: Amlodipine 10mg  daily Goal BP between 105-129/60 Send BP readings through MyChart in 1 week for follow-up

## 2020-09-02 NOTE — Progress Notes (Signed)
Virtual Video Visit via MyChart Note  I connected with  Samantha Simpson on 09/02/20 at  2:10 PM EDT by the video enabled telemedicine application for , MyChart, and verified that I am speaking with the correct person using two identifiers.   I introduced myself as a Publishing rights manager with the practice. We discussed the limitations of evaluation and management by telemedicine and the availability of in person appointments. The patient expressed understanding and agreed to proceed.  Participating parties in this visit include: The patient and the nurse practitioner listed.  The patient is: At home I am: In the office  Subjective:    CC:  Chief Complaint  Patient presents with  . Follow-up  . Hypertension    HPI: Samantha Simpson is a 50 y.o. year old female presenting today via MyChart today for blood pressure follow-up after starting medication one week ago for significantly elevated blood pressure readings.  At her last visit her BP readings were 190/116 and 178/114. She was treated with clonidine in the office and started on amlodipine 5mg  with instructions to monitor BP at home. Lower dosage medication utilized to avoid sharp drop in BP to prevent symptoms and rebound.  Since starting the amlodipine she reports her BP readings have been going down. The first day it was 217/109 a few days later 183/103, 174/95, and last night 147/93 Today her BP is 172/109, but she has been at work and thinks that may have something to do with the increased numbers.  She denies any side effects of the medication, no dizziness, vision changes, headaches, shortness of breath, or chest pain.   She is continuing to work on her diet and exercise and doing a terrific job at cutting out sweets and snacks.   Past medical history, Surgical history, Family history not pertinant except as noted below, Social history, Allergies, and medications have been entered into the medical record, reviewed, and  corrections made.   Review of Systems:  All review of systems negative except what is listed in the HPI   Objective:    General:  Speaking clearly in complete sentences. Absent shortness of breath noted.   Alert and oriented x3.   Normal judgment.  Absent acute distress.   Impression and Recommendations:    1. Hypertension, unspecified type - amLODipine (NORVASC) 5 MG tablet; Take 2 tablets (10 mg total) by mouth daily.  Dispense: 60 tablet; Refill: 2  Assessment: BP improving with Amlodipine 5mg  No reported SE of medication No hypotension or edema Discussion/Recommendations: Increase medication to Amlodipine 10mg  per day Continue with diet and exercise habits Continue to monitor BP daily Monitor for side effects or blood pressures lower than 100/50- report Report any changes or new symptoms immediately Plan: Amlodipine 10mg  daily Goal BP between 105-129/60 Send BP readings through MyChart in 1 week for follow-up  Follow-up if symptoms worsen or fail to improve.    I discussed the assessment and treatment plan with the patient. The patient was provided an opportunity to ask questions and all were answered. The patient agreed with the plan and demonstrated an understanding of the instructions.   The patient was advised to call back or seek an in-person evaluation if the symptoms worsen or if the condition fails to improve as anticipated.  I provided 20 minutes of non-face-to-face interaction with this MYCHART visit including intake, same-day documentation, and chart review.   , NP

## 2020-09-08 ENCOUNTER — Encounter (HOSPITAL_BASED_OUTPATIENT_CLINIC_OR_DEPARTMENT_OTHER): Payer: Self-pay | Admitting: Nurse Practitioner

## 2020-09-08 ENCOUNTER — Other Ambulatory Visit (HOSPITAL_BASED_OUTPATIENT_CLINIC_OR_DEPARTMENT_OTHER): Payer: Self-pay | Admitting: Nurse Practitioner

## 2020-09-08 DIAGNOSIS — I10 Essential (primary) hypertension: Secondary | ICD-10-CM

## 2020-09-08 MED ORDER — VALSARTAN-HYDROCHLOROTHIAZIDE 80-12.5 MG PO TABS: ORAL_TABLET | ORAL | 2 refills | Status: DC

## 2020-10-12 ENCOUNTER — Encounter (HOSPITAL_BASED_OUTPATIENT_CLINIC_OR_DEPARTMENT_OTHER): Payer: Self-pay | Admitting: Nurse Practitioner

## 2020-10-12 ENCOUNTER — Ambulatory Visit (INDEPENDENT_AMBULATORY_CARE_PROVIDER_SITE_OTHER): Payer: BC Managed Care – PPO | Admitting: Nurse Practitioner

## 2020-10-12 ENCOUNTER — Other Ambulatory Visit: Payer: Self-pay

## 2020-10-12 VITALS — BP 155/101 | HR 90 | Resp 12 | Ht 65.0 in | Wt 292.0 lb

## 2020-10-12 DIAGNOSIS — Z1231 Encounter for screening mammogram for malignant neoplasm of breast: Secondary | ICD-10-CM

## 2020-10-12 DIAGNOSIS — I10 Essential (primary) hypertension: Secondary | ICD-10-CM | POA: Diagnosis not present

## 2020-10-12 DIAGNOSIS — N63 Unspecified lump in unspecified breast: Secondary | ICD-10-CM

## 2020-10-12 MED ORDER — AMLODIPINE BESYLATE 10 MG PO TABS: 10.0000 mg | ORAL_TABLET | Freq: Every day | ORAL | 3 refills | Status: DC

## 2020-10-12 MED ORDER — VALSARTAN-HYDROCHLOROTHIAZIDE 160-25 MG PO TABS: 1.0000 | ORAL_TABLET | Freq: Every day | ORAL | 3 refills | Status: DC

## 2020-10-12 NOTE — Progress Notes (Signed)
Established Patient Office Visit  Subjective:  Patient ID: Samantha Simpson, female    DOB: 10-12-1970  Age: 50 y.o. MRN: [id number]  CC:  Chief Complaint  Patient presents with   Breast Problem    HPI [facility] presents for mass noted in the right breast at about the 3 o'clock position medially. She first noticed this a couple of months ago and has been monitoring this. She reports that it is more palpable when she lays back. It is not painful, except when she has pressed on it multiple times.  She denies changes to the skin on or around the breast, fevers, weight loss, night sweats, discharge from the nipple. No family hx of breast cancer.  Self breast exams monthly with no detection of this previously.   Her BP is still elevated at this time.  No HA, CP, palpitations, dizziness, weakness, vision changes, ShOB.  Taking BP at home daily with readings consistently in 160's/90's. Two BP in 110's/70's right after nap. No low BP. LE edema has improved.   Outpatient Medications Prior to Visit  Medication Sig Dispense Refill   DENTA 5000 PLUS 1.1 % CREA dental cream Take by mouth.     amLODipine (NORVASC) 5 MG tablet Take 2 tablets (10 mg total) by mouth daily. 60 tablet 2   valsartan-hydrochlorothiazide (DIOVAN-HCT) 80-12.5 MG tablet Take one tablet (80-12.65m) by mouth every evening for 7 days. Increase to two tablets (160-239m by mouth every evening as directed by provider. 60 tablet 2   Facility-Administered Medications Prior to Visit  Medication Dose Route Frequency Provider Last Rate Last Admin   cloNIDine (CATAPRES) tablet 0.2 mg  0.2 mg Oral Once Addeline Calarco, SaCoralee PesaNP        Allergies  Allergen Reactions   Penicillins Rash    Has patient had a PCN reaction causing immediate rash, facial/tongue/throat swelling, SOB or lightheadedness with hypotension: Yes Has patient had a PCN reaction causing severe rash involving mucus membranes or skin necrosis: No Has  patient had a PCN reaction that required hospitalization: No Has patient had a PCN reaction occurring within the last 10 years: No If all of the above answers are "NO", then may proceed with Cephalosporin use. (Reports she can sometimes tolerate this)     ROS Review of Systems All review of systems negative except what is listed in the HPI    Objective:    Physical Exam Vitals and nursing note reviewed.  Constitutional:      Appearance: Normal appearance. She is obese.  HENT:     Head: Normocephalic.  Eyes:     Extraocular Movements: Extraocular movements intact.     Conjunctiva/sclera: Conjunctivae normal.     Pupils: Pupils are equal, round, and reactive to light.  Neck:     Vascular: No carotid bruit.  Cardiovascular:     Rate and Rhythm: Normal rate and regular rhythm.     Pulses: Normal pulses.     Heart sounds: Normal heart sounds.  Pulmonary:     Effort: Pulmonary effort is normal.     Breath sounds: Normal breath sounds.  Chest:  Breasts:    Tanner Score is 5.     Breasts are symmetrical.     Right: Mass and tenderness present. No swelling, bleeding, inverted nipple, nipple discharge, skin change, axillary adenopathy or supraclavicular adenopathy.     Left: No swelling, bleeding, inverted nipple, mass, nipple discharge, skin change, tenderness, axillary adenopathy or supraclavicular adenopathy.  Comments: Scattered fibrous tissue palpable in breasts bilaterally.  Abdominal:     General: Abdomen is flat. Bowel sounds are normal. There is no distension.     Palpations: Abdomen is soft.     Tenderness: There is no abdominal tenderness. There is no right CVA tenderness, left CVA tenderness, guarding or rebound.  Musculoskeletal:        General: Normal range of motion.     Cervical back: Normal range of motion and neck supple.  Lymphadenopathy:     Cervical: No cervical adenopathy.     Upper Body:     Right upper body: No supraclavicular, axillary or  pectoral adenopathy.     Left upper body: No supraclavicular, axillary or pectoral adenopathy.  Skin:    General: Skin is warm and dry.     Capillary Refill: Capillary refill takes less than 2 seconds.  Neurological:     General: No focal deficit present.     Mental Status: She is alert and oriented to person, place, and time.  Psychiatric:        Mood and Affect: Mood normal.        Behavior: Behavior normal.        Thought Content: Thought content normal.        Judgment: Judgment normal.    BP (!) 155/101   Pulse 90   Resp 12   Ht '5\' 5"'  (1.651 m)   Wt 292 lb (132.5 kg)   LMP 10/02/2020 (Approximate)   SpO2 99%   BMI 48.59 kg/m  Wt Readings from Last 3 Encounters:  10/12/20 292 lb (132.5 kg)  08/26/20 293 lb 3.2 oz (133 kg)  02/26/19 290 lb (131.5 kg)     Health Maintenance Due  Topic Date Due   HIV Screening  Never done   Hepatitis C Screening  Never done   TETANUS/TDAP  Never done   PAP SMEAR-Modifier  10/19/2014   COLONOSCOPY (Pts 45-28yr Insurance coverage will need to be confirmed)  Never done   MAMMOGRAM  02/23/2020   Zoster Vaccines- Shingrix (1 of 2) Never done   COVID-19 Vaccine (3 - Booster for Moderna series) 03/25/2020    There are no preventive care reminders to display for this patient.  No results found for: TSH Lab Results  Component Value Date   WBC 10.6 10/12/2020   HGB 10.2 (L) 10/12/2020   HCT 32.0 (L) 10/12/2020   MCV 82 10/12/2020   PLT 432 10/12/2020   Lab Results  Component Value Date   NA 138 10/12/2020   K 3.9 10/12/2020   CO2 26 10/12/2020   GLUCOSE 162 (H) 10/12/2020   BUN 14 10/12/2020   CREATININE 0.88 10/12/2020   BILITOT <0.2 10/12/2020   ALKPHOS 92 10/12/2020   AST 17 10/12/2020   ALT 16 10/12/2020   PROT 7.9 10/12/2020   ALBUMIN 4.1 10/12/2020   CALCIUM 9.8 10/12/2020   ANIONGAP 8 03/01/2016   EGFR 80 10/12/2020   Lab Results  Component Value Date   CHOL 152 10/19/2011   Lab Results  Component Value Date    HDL 40 10/19/2011   Lab Results  Component Value Date   LDLCALC 88 10/19/2011   Lab Results  Component Value Date   TRIG 118 10/19/2011   Lab Results  Component Value Date   CHOLHDL 3.8 10/19/2011   No results found for: HGBA1C    Assessment & Plan:   Problem List Items Addressed This Visit     Hypertension  BP improved, but remains elevated.  Plan: Continue amlodipine 8m per day  Increase valsartan-hctz to 160/237mdaily Continue to monitor BP daily and report readings bi-weekly Gaol BP less than 130/80 Labs today F/U in 6 months or sooner if needed.        Relevant Medications   valsartan-hydrochlorothiazide (DIOVAN-HCT) 160-25 MG tablet   amLODipine (NORVASC) 10 MG tablet   Other Relevant Orders   Comprehensive metabolic panel (Completed)   CBC with Differential/Platelet (Completed)   Breast lump or mass - Primary    New lump detected in left breast between 2 and 3 o'clock position medially.  Firm, nodularity noted with tenderness possibly due to manipulation. Last mammogram several years ago Recommend Diagnostic mammogram L Screening mammogram R PLAN Mammograms ordered.  F/U if any changes noted.         Relevant Orders   MM Digital Diagnostic Unilat R   Other Visit Diagnoses     Breast cancer screening by mammogram       Relevant Orders   MM Digital Screening Unilat L       Meds ordered this encounter  Medications   valsartan-hydrochlorothiazide (DIOVAN-HCT) 160-25 MG tablet    Sig: Take 1 tablet by mouth daily.    Dispense:  90 tablet    Refill:  3   amLODipine (NORVASC) 10 MG tablet    Sig: Take 1 tablet (10 mg total) by mouth daily.    Dispense:  90 tablet    Refill:  3    Follow-up: Return in about 6 months (around 04/14/2021) for HTN.    SaOrma RenderNP

## 2020-10-12 NOTE — Patient Instructions (Addendum)
I will send the referral in for mammogram for you. They should call you to schedule in the next few days. If you have not heard from them by Friday, please let me know.   They will send you results, but I will also send you a message when the results come through.    I would like to increase the Valsartan-HCTZ to 2 (160-25mg ) tablets every evening until you finish the current prescription- the new prescription will just be 1 tablet (160-25mg ) Continue to take the amlodipine 2 (5mg ) tablets daily until you finish the current prescription- the new prescription will just be 1 tablet (10mg ).  We want your goal BP to be 130/80 or less.

## 2020-10-13 ENCOUNTER — Encounter (HOSPITAL_BASED_OUTPATIENT_CLINIC_OR_DEPARTMENT_OTHER): Payer: Self-pay | Admitting: Nurse Practitioner

## 2020-10-13 LAB — COMPREHENSIVE METABOLIC PANEL
ALT: 16 IU/L (ref 0–32)
AST: 17 IU/L (ref 0–40)
Albumin/Globulin Ratio: 1.1 — ABNORMAL LOW (ref 1.2–2.2)
Albumin: 4.1 g/dL (ref 3.8–4.8)
Alkaline Phosphatase: 92 IU/L (ref 44–121)
BUN/Creatinine Ratio: 16 (ref 9–23)
BUN: 14 mg/dL (ref 6–24)
Bilirubin Total: <0.2 mg/dL (ref 0.0–1.2)
CO2: 26 mmol/L (ref 20–29)
Calcium: 9.8 mg/dL (ref 8.7–10.2)
Chloride: 97 mmol/L (ref 96–106)
Creatinine, Ser: 0.88 mg/dL (ref 0.57–1.00)
Globulin, Total: 3.8 g/dL (ref 1.5–4.5)
Glucose: 162 mg/dL — ABNORMAL HIGH (ref 65–99)
Potassium: 3.9 mmol/L (ref 3.5–5.2)
Sodium: 138 mmol/L (ref 134–144)
Total Protein: 7.9 g/dL (ref 6.0–8.5)
eGFR: 80 mL/min/{1.73_m2} (ref >59)

## 2020-10-13 LAB — CBC WITH DIFFERENTIAL/PLATELET
Basophils Absolute: 0.1 10*3/uL (ref 0.0–0.2)
Basos: 1 %
EOS (ABSOLUTE): 0.1 10*3/uL (ref 0.0–0.4)
Eos: 1 %
Hematocrit: 32 % — ABNORMAL LOW (ref 34.0–46.6)
Hemoglobin: 10.2 g/dL — ABNORMAL LOW (ref 11.1–15.9)
Immature Grans (Abs): 0 10*3/uL (ref 0.0–0.1)
Immature Granulocytes: 0 %
Lymphocytes Absolute: 2.2 10*3/uL (ref 0.7–3.1)
Lymphs: 21 %
MCH: 26 pg — ABNORMAL LOW (ref 26.6–33.0)
MCHC: 31.9 g/dL (ref 31.5–35.7)
MCV: 82 fL (ref 79–97)
Monocytes Absolute: 0.6 10*3/uL (ref 0.1–0.9)
Monocytes: 6 %
Neutrophils Absolute: 7.6 10*3/uL — ABNORMAL HIGH (ref 1.4–7.0)
Neutrophils: 71 %
Platelets: 432 10*3/uL (ref 150–450)
RBC: 3.92 x10E6/uL (ref 3.77–5.28)
RDW: 16.2 % — ABNORMAL HIGH (ref 11.7–15.4)
WBC: 10.6 10*3/uL (ref 3.4–10.8)

## 2020-10-13 NOTE — Assessment & Plan Note (Signed)
New lump detected in left breast between 2 and 3 o'clock position medially.  Firm, nodularity noted with tenderness possibly due to manipulation. Last mammogram several years ago Recommend Diagnostic mammogram L Screening mammogram R PLAN Mammograms ordered.  F/U if any changes noted.

## 2020-10-13 NOTE — Assessment & Plan Note (Signed)
BP improved, but remains elevated.  Plan: Continue amlodipine 10mg  per day  Increase valsartan-hctz to 160/25mg  daily Continue to monitor BP daily and report readings bi-weekly Gaol BP less than 130/80 Labs today F/U in 6 months or sooner if needed.

## 2020-10-14 ENCOUNTER — Other Ambulatory Visit: Payer: Self-pay | Admitting: Nurse Practitioner

## 2020-10-14 DIAGNOSIS — N6310 Unspecified lump in the right breast, unspecified quadrant: Secondary | ICD-10-CM

## 2020-10-14 DIAGNOSIS — N63 Unspecified lump in unspecified breast: Secondary | ICD-10-CM

## 2020-10-18 NOTE — Progress Notes (Signed)
Please call patient:  Please call [facility] and add on Fe/TIBC/Ferritin, peripheral blood smear, and reticulocyte count for decreased hemoglobin and hematocrit. If possible, I would like to add an A1c as well for elevated fasting blood glucose. If unable to add, please schedule nurse visit for fasting labs in the next 1-2 weeks.   Blood counts show anemia is present, but it looks stable from previous results. Have you had to have iron infusions in the past? I am going to add a lab to see if we can find the source of the low levels.   Your blood sugar was also very elevated. Were you fasting for these labs? I can't remember.

## 2020-10-22 ENCOUNTER — Telehealth (HOSPITAL_BASED_OUTPATIENT_CLINIC_OR_DEPARTMENT_OTHER): Payer: Self-pay

## 2020-10-22 DIAGNOSIS — R739 Hyperglycemia, unspecified: Secondary | ICD-10-CM

## 2020-10-22 DIAGNOSIS — R710 Precipitous drop in hematocrit: Secondary | ICD-10-CM

## 2020-10-22 NOTE — Telephone Encounter (Signed)
-----   Message from Tollie Eth, NP sent at 10/18/2020  8:10 AM EDT ----- Please call patient:  Please call [facility] and add on Fe/TIBC/Ferritin, peripheral blood smear, and reticulocyte count for decreased hemoglobin and hematocrit. If possible, I would like to add an A1c as well for elevated fasting blood glucose. If unable to add, please schedule nurse visit for fasting labs in the next 1-2 weeks.   Blood counts show anemia is present, but it looks stable from previous results. Have you had to have iron infusions in the past? I am going to add a lab to see if we can find the source of the low levels.   Your blood sugar was also very elevated. Were you fasting for these labs? I can't remember.

## 2020-10-22 NOTE — Telephone Encounter (Signed)
Results released by Shawna Clamp, AGNP.  Called patient to discuss lab results and recommendations.  Patient it s aware and understands.  Placed orders for add on Fe/TIBC/Ferritin, peripheral blood smear, reticulocyte count and A1c to [facility].   Instructed patient to contact the office with any questions or concerns.

## 2020-10-22 NOTE — Telephone Encounter (Signed)
-----   Message from Sara E Early, NP sent at 10/18/2020  8:10 AM EDT ----- Please call patient:  Please call [facility] and add on Fe/TIBC/Ferritin, peripheral blood smear, and reticulocyte count for decreased hemoglobin and hematocrit. If possible, I would like to add an A1c as well for elevated fasting blood glucose. If unable to add, please schedule nurse visit for fasting labs in the next 1-2 weeks.   Blood counts show anemia is present, but it looks stable from previous results. Have you had to have iron infusions in the past? I am going to add a lab to see if we can find the source of the low levels.   Your blood sugar was also very elevated. Were you fasting for these labs? I can't remember.   

## 2020-10-23 ENCOUNTER — Ambulatory Visit
Admission: RE | Admit: 2020-10-23 | Discharge: 2020-10-23 | Disposition: A | Discharge status: Home / Self Care | Payer: BC Managed Care – PPO | Source: Ambulatory Visit | Attending: Nurse Practitioner | Admitting: Nurse Practitioner

## 2020-10-23 ENCOUNTER — Other Ambulatory Visit: Payer: Self-pay

## 2020-10-23 DIAGNOSIS — R922 Inconclusive mammogram: Secondary | ICD-10-CM | POA: Diagnosis not present

## 2020-10-23 DIAGNOSIS — N6310 Unspecified lump in the right breast, unspecified quadrant: Secondary | ICD-10-CM

## 2020-10-23 DIAGNOSIS — N63 Unspecified lump in unspecified breast: Secondary | ICD-10-CM

## 2020-10-25 ENCOUNTER — Other Ambulatory Visit: Payer: Self-pay

## 2020-10-25 ENCOUNTER — Ambulatory Visit (HOSPITAL_BASED_OUTPATIENT_CLINIC_OR_DEPARTMENT_OTHER): Payer: BC Managed Care – PPO

## 2020-10-25 DIAGNOSIS — R739 Hyperglycemia, unspecified: Secondary | ICD-10-CM

## 2020-10-25 DIAGNOSIS — R710 Precipitous drop in hematocrit: Secondary | ICD-10-CM

## 2020-10-25 NOTE — Progress Notes (Signed)
If patient has not read in 2 days, please call with the following information:  Mammogram shows normal fibrous tissue at the mammary glands. No areas of concern found for possible masses. Continue to monitor the area for any changes.  If you would like, we can refer you to a breast specialist for further evaluation.

## 2020-10-27 LAB — PATHOLOGIST SMEAR REVIEW
Basophils Absolute: 0 x10E3/uL (ref 0.0–0.2)
Basos: 1 %
EOS (ABSOLUTE): 0.1 x10E3/uL (ref 0.0–0.4)
Eos: 1 %
Hematocrit: 31.4 % — ABNORMAL LOW (ref 34.0–46.6)
Hemoglobin: 10 g/dL — ABNORMAL LOW (ref 11.1–15.9)
Immature Grans (Abs): 0 x10E3/uL (ref 0.0–0.1)
Immature Granulocytes: 0 %
Lymphocytes Absolute: 2.3 x10E3/uL (ref 0.7–3.1)
Lymphs: 29 %
MCH: 26.3 pg — ABNORMAL LOW (ref 26.6–33.0)
MCHC: 31.8 g/dL (ref 31.5–35.7)
MCV: 83 fL (ref 79–97)
Monocytes Absolute: 0.5 x10E3/uL (ref 0.1–0.9)
Monocytes: 7 %
Neutrophils Absolute: 5 x10E3/uL (ref 1.4–7.0)
Neutrophils: 62 %
Platelets: 405 x10E3/uL (ref 150–450)
RBC: 3.8 x10E6/uL (ref 3.77–5.28)
RDW: 16 % — ABNORMAL HIGH (ref 11.7–15.4)
WBC: 8 x10E3/uL (ref 3.4–10.8)

## 2020-10-27 LAB — RETICULOCYTES: Retic Ct Pct: 1.5 % (ref 0.6–2.6)

## 2020-10-27 LAB — IRON,TIBC AND FERRITIN PANEL
Ferritin: 9 ng/mL — ABNORMAL LOW (ref 15–150)
Iron Saturation: 12 % — ABNORMAL LOW (ref 15–55)
Iron: 38 ug/dL (ref 27–159)
Total Iron Binding Capacity: 326 ug/dL (ref 250–450)
UIBC: 288 ug/dL (ref 131–425)

## 2020-10-27 LAB — HEMOGLOBIN A1C
Est. average glucose Bld gHb Est-mCnc: 154 mg/dL
Hgb A1c MFr Bld: 7 % — ABNORMAL HIGH (ref 4.8–5.6)

## 2020-10-28 ENCOUNTER — Telehealth (HOSPITAL_BASED_OUTPATIENT_CLINIC_OR_DEPARTMENT_OTHER): Payer: Self-pay

## 2020-10-28 NOTE — Telephone Encounter (Signed)
-----   Message from Tollie Eth, NP sent at 10/25/2020  8:17 AM EDT ----- If patient has not read in 2 days, please call with the following information:  Mammogram shows normal fibrous tissue at the mammary glands. No areas of concern found for possible masses. Continue to monitor the area for any changes.  If you would like, we can refer you to a breast specialist for further evaluation.

## 2020-10-28 NOTE — Telephone Encounter (Signed)
Called patient to discuss results and recommendations.  Patient is aware and understands.  Instructed patient to contact the office with questions and concerns.

## 2020-10-28 NOTE — Telephone Encounter (Signed)
Left message for patient to call back  

## 2020-10-29 NOTE — Telephone Encounter (Signed)
Error

## 2020-10-29 NOTE — Telephone Encounter (Signed)
error 

## 2020-11-05 ENCOUNTER — Telehealth (HOSPITAL_BASED_OUTPATIENT_CLINIC_OR_DEPARTMENT_OTHER): Payer: Self-pay

## 2020-11-05 NOTE — Telephone Encounter (Signed)
-----   Message from Tollie Eth, NP sent at 11/05/2020  7:42 AM EDT ----- A1c shows diabetes at 7.0% - please call patient to let her know and schedule visit in next few weeks to discuss this and recommendations.   Iron levels are low- indicate iron deficiency. Recommend ferrous sulfate 325mg  daily to help with this. We can discuss at upcoming visit.

## 2020-11-05 NOTE — Progress Notes (Signed)
A1c shows diabetes at 7.0% - please call patient to let her know and schedule visit in next few weeks to discuss this and recommendations.   Iron levels are low- indicate iron deficiency. Recommend ferrous sulfate 325mg  daily to help with this. We can discuss at upcoming visit.

## 2020-11-05 NOTE — Telephone Encounter (Signed)
Patient is aware and agreeable to lab results and recommendations Office visit scheduled for 08/01 to discuss

## 2020-11-08 ENCOUNTER — Ambulatory Visit (HOSPITAL_BASED_OUTPATIENT_CLINIC_OR_DEPARTMENT_OTHER): Payer: BC Managed Care – PPO | Admitting: Nurse Practitioner

## 2020-11-08 ENCOUNTER — Other Ambulatory Visit: Payer: Self-pay

## 2020-11-08 ENCOUNTER — Encounter (HOSPITAL_BASED_OUTPATIENT_CLINIC_OR_DEPARTMENT_OTHER): Payer: Self-pay | Admitting: Nurse Practitioner

## 2020-11-08 VITALS — BP 144/78 | HR 86 | Wt 287.4 lb

## 2020-11-08 DIAGNOSIS — E1169 Type 2 diabetes mellitus with other specified complication: Secondary | ICD-10-CM | POA: Insufficient documentation

## 2020-11-08 DIAGNOSIS — E1165 Type 2 diabetes mellitus with hyperglycemia: Secondary | ICD-10-CM | POA: Diagnosis not present

## 2020-11-08 DIAGNOSIS — I152 Hypertension secondary to endocrine disorders: Secondary | ICD-10-CM

## 2020-11-08 DIAGNOSIS — D649 Anemia, unspecified: Secondary | ICD-10-CM

## 2020-11-08 DIAGNOSIS — E349 Endocrine disorder, unspecified: Secondary | ICD-10-CM

## 2020-11-08 DIAGNOSIS — Z1211 Encounter for screening for malignant neoplasm of colon: Secondary | ICD-10-CM

## 2020-11-08 DIAGNOSIS — E119 Type 2 diabetes mellitus without complications: Secondary | ICD-10-CM | POA: Insufficient documentation

## 2020-11-08 DIAGNOSIS — E785 Hyperlipidemia, unspecified: Secondary | ICD-10-CM

## 2020-11-08 MED ORDER — FERROUS SULFATE 325 (65 FE) MG PO TBEC: 325.0000 mg | DELAYED_RELEASE_TABLET | Freq: Every day | ORAL | 3 refills | Status: AC

## 2020-11-08 MED ORDER — ATORVASTATIN CALCIUM 10 MG PO TABS: 10.0000 mg | ORAL_TABLET | Freq: Every day | ORAL | 3 refills | Status: DC

## 2020-11-08 MED ORDER — BLOOD GLUCOSE MONITOR KIT: PACK | 99 refills | Status: AC

## 2020-11-08 MED ORDER — METFORMIN HCL 500 MG PO TABS: ORAL_TABLET | ORAL | 1 refills | Status: DC

## 2020-11-08 NOTE — Progress Notes (Signed)
Established Patient Office Visit  Subjective:  Patient ID: Samantha Simpson, female    DOB: [birth year]  Age: 50 y.o. MRN: [id number]  CC: No chief complaint on file.   HPI Munirah Doerner presents for new diagnosis of DM and Anemia.  ANEMIA No heavy periods- last 3-4 days No other bleeding noted- normal BM Maternal aunt colon cancer, brother has colon polyps Has not had colonoscopy Historically elevated neutrophils  DIABETES Recent lab results reveal Hgb A1c of 7.0%.  Finnley has not been on medication for diabetes in the past.  Her diet typically consists of greek yogurt and fresh fruit, she has started drinking glucerna protein drinks (30g), grilled chicken salad for lunch with 38m sodium in dressing, dinner she typically eats grilled meat, mixed vegetables, fruits, and salads She reports she has improved her diet since she starting being seen in this office She does exercise on a routine basis by walking her dogs daily, she has ordered a tredmill and elipitical machine for her home. [facility] 2-3 times a week with her friend.  Her mother had diabetes- she is familiar with checking blood glucose  Past Medical History:  Diagnosis Date   Acute bronchitis 06/08/2016   ASCUS (atypical squamous cells of undetermined significance) on Pap smear 1997   BV (bacterial vaginosis) 2001   CIN I (cervical intraepithelial neoplasia I) 2004   Exposure to chlamydia, mucopurulent cervitis/nongonococcal urethritis 1997   H/O fatigue 2010   H/O: obesity 1997   HGSIL (high grade squamous intraepithelial dysplasia)    Increased BMI 2006   Ovarian cyst, left 1996   Renal stones    Routine gynecological examination 10/19/2011   Ureteral stone with hydronephrosis 02/24/2016    Past Surgical History:  Procedure Laterality Date   [facility]     CYSTOSCOPY WITH STENT PLACEMENT Left 02/23/2016   Procedure: CYSTOSCOPY WITH STENT PLACEMENT;   Surgeon: LRaynelle Bring MD;  Location: [facility];  Service: Urology;  Laterality: Left;   CYSTOSCOPY WITH URETEROSCOPY, STONE BASKETRY AND STENT PLACEMENT Left 03/13/2016   Procedure: CYSTOSCOPY WITH URETEROSCOPY, STONE BASKETRY AND  left ureter removal of stent;  Surgeon: LRaynelle Bring MD;  Location: [facility];  Service: Urology;  Laterality: Left;   LAPAROSCOPY  1999   left ovary    Family History  Problem Relation Age of Onset   Diabetes Mother    Hypertension Mother     Social History   Socioeconomic History   Marital status: Married    Spouse name: Not on file   Number of children: Not on file   Years of education: Not on file   Highest education level: Not on file  Occupational History   Not on file  Tobacco Use   Smoking status: Never   Smokeless tobacco: Never  Substance and Sexual Activity   Alcohol use: No   Drug use: No   Sexual activity: Yes    Birth control/protection: None  Other Topics Concern   Not on file  Social History Narrative   Not on file   Social Determinants of Health   Financial Resource Strain: Not on file  Food Insecurity: Not on file  Transportation Needs: Not on file  Physical Activity: Not on file  Stress: Not on file  Social Connections: Not on file  Intimate Partner Violence: Not on file    Outpatient Medications Prior to Visit  Medication Sig Dispense Refill   amLODipine (NGrady 10  MG tablet Take 1 tablet (10 mg total) by mouth daily. 90 tablet 3   DENTA 5000 PLUS 1.1 % CREA dental cream Take by mouth.     valsartan-hydrochlorothiazide (DIOVAN-HCT) 160-25 MG tablet Take 1 tablet by mouth daily. 90 tablet 3   Facility-Administered Medications Prior to Visit  Medication Dose Route Frequency Provider Last Rate Last Admin   cloNIDine (CATAPRES) tablet 0.2 mg  0.2 mg Oral Once Chinedu Agustin, Coralee Pesa, NP        Allergies  Allergen Reactions   Penicillins Rash    Has patient had a PCN reaction causing immediate rash, facial/tongue/throat  swelling, SOB or lightheadedness with hypotension: Yes Has patient had a PCN reaction causing severe rash involving mucus membranes or skin necrosis: No Has patient had a PCN reaction that required hospitalization: No Has patient had a PCN reaction occurring within the last 10 years: No If all of the above answers are "NO", then may proceed with Cephalosporin use. (Reports she can sometimes tolerate this)     ROS Review of Systems  Constitutional:  Positive for chills and fatigue. Negative for appetite change and unexpected weight change.  Respiratory:  Negative for chest tightness and shortness of breath.   Cardiovascular:  Negative for chest pain and palpitations.  Gastrointestinal:  Negative for abdominal distention and blood in stool.  Endocrine: Positive for cold intolerance.  Genitourinary:  Negative for hematuria, menstrual problem, vaginal bleeding and vaginal discharge.  Skin:  Negative for color change, pallor and wound.  Neurological:  Negative for tremors, weakness, light-headedness and headaches.  Hematological:  Negative for adenopathy. Does not bruise/bleed easily.     Objective:    Physical Exam Vitals and nursing note reviewed.  Constitutional:      Appearance: Normal appearance. She is obese.  HENT:     Head: Normocephalic and atraumatic.  Eyes:     Extraocular Movements: Extraocular movements intact.     Conjunctiva/sclera: Conjunctivae normal.     Pupils: Pupils are equal, round, and reactive to light.  Cardiovascular:     Rate and Rhythm: Normal rate and regular rhythm.     Pulses: Normal pulses.     Heart sounds: Normal heart sounds.  Pulmonary:     Effort: Pulmonary effort is normal.     Breath sounds: Normal breath sounds.  Abdominal:     General: Abdomen is flat.     Palpations: Abdomen is soft.  Musculoskeletal:        General: Normal range of motion.     Cervical back: Normal range of motion.     Right lower leg: No edema.     Left lower leg:  No edema.  Skin:    General: Skin is warm.     Capillary Refill: Capillary refill takes less than 2 seconds.  Neurological:     General: No focal deficit present.     Mental Status: She is alert and oriented to person, place, and time.     Cranial Nerves: No cranial nerve deficit.     Sensory: No sensory deficit.     Motor: No weakness.     Coordination: Coordination normal.     Gait: Gait normal.  Psychiatric:        Mood and Affect: Mood normal.        Behavior: Behavior normal.        Thought Content: Thought content normal.        Judgment: Judgment normal.    There were no vitals taken  for this visit. Wt Readings from Last 3 Encounters:  10/12/20 292 lb (132.5 kg)  08/26/20 293 lb 3.2 oz (133 kg)  02/26/19 290 lb (131.5 kg)     Health Maintenance Due  Topic Date Due   HIV Screening  Never done   Hepatitis C Screening  Never done   TETANUS/TDAP  Never done   PAP SMEAR-Modifier  10/19/2014   COLONOSCOPY (Pts 45-71yr Insurance coverage will need to be confirmed)  Never done   Zoster Vaccines- Shingrix (1 of 2) Never done   COVID-19 Vaccine (3 - Booster for Moderna series) 03/25/2020   INFLUENZA VACCINE  11/08/2020    There are no preventive care reminders to display for this patient.  No results found for: TSH Lab Results  Component Value Date   WBC 8.0 10/25/2020   HGB 10.0 (L) 10/25/2020   HCT 31.4 (L) 10/25/2020   MCV 83 10/25/2020   PLT 405 10/25/2020   Lab Results  Component Value Date   NA 138 10/12/2020   K 3.9 10/12/2020   CO2 26 10/12/2020   GLUCOSE 162 (H) 10/12/2020   BUN 14 10/12/2020   CREATININE 0.88 10/12/2020   BILITOT <0.2 10/12/2020   ALKPHOS 92 10/12/2020   AST 17 10/12/2020   ALT 16 10/12/2020   PROT 7.9 10/12/2020   ALBUMIN 4.1 10/12/2020   CALCIUM 9.8 10/12/2020   ANIONGAP 8 03/01/2016   EGFR 80 10/12/2020   Lab Results  Component Value Date   CHOL 152 10/19/2011   Lab Results  Component Value Date   HDL 40 10/19/2011    Lab Results  Component Value Date   LDLCALC 88 10/19/2011   Lab Results  Component Value Date   TRIG 118 10/19/2011   Lab Results  Component Value Date   CHOLHDL 3.8 10/19/2011   Lab Results  Component Value Date   HGBA1C 7.0 (H) 10/25/2020      Assessment & Plan:   Problem List Items Addressed This Visit   None   No orders of the defined types were placed in this encounter.   Follow-up: No follow-ups on file.    SOrma Render NP

## 2020-11-08 NOTE — Assessment & Plan Note (Signed)
IDE present with no indication of blood loss- this appears to be a chronic problem for her from result review Family hx of colon cancer is present No red flags today Will start ferrous sulfate 325mg  daily to see if we can improve these numbers Colonoscopy referral placed for evaluation F/U in 3 months for repeat labs

## 2020-11-08 NOTE — Patient Instructions (Addendum)
Recommendations: [email or web address].[email or web address]  I have sent the ferrous sulfate (iron) prescription to the pharmacy for you. If one every day is not tolerable, try dosing this every other day to see if that helps.   We will plan to follow-up in 3 months with FASTING labs to see how you are doing. Please schedule this visit before leaving today.   Start metformin ASAP You may notice changes on your stools, this is normal and expected.  If loose stools are uncontrollable, please let me know Start Atorvastatin ASAP- take at bedtime This medication will help protect your blood vessels from plaque build-up and reduce your risk of stroke and heart attack.  Having diabetes and high blood pressure increases your risks of both of these, so it is important to prevent elevated cholesterol to best protect you.  Monitor your blood sugar at least once a day- first thing in the morning before eating anything is best To see what foods have an impact on your blood sugar, you may check your blood sugar 2 hours after meals to see how your body responds If your blood sugar is more than 165 two hours after eating, this is an indication that the food/drink you consumed is higher in carbohydrates than you should consume- avoid this food or limit the portions to help better control your blood sugars For best protection, we recommend a flu shot this fall- check with [place] in September to see if we have this years dosing in Patients with diabetes are at higher risk of significant complications from the flu You will need a dilated eye exam every year to monitor the blood vessels in your eyes The eye is the only location we can visualize blood vessels, dilated eye exams help [place] to determine if your blood vessels are damaged from diabetes and give [place] a clearer picture of changes we need to make for your control You will need 2 pneumonia vaccines, each one year apart for protection.   Patients with diabetes are at higher risk of pneumonia and significant complications from this.  The vaccines help protect you against the most common and dangerous forms of pneumonia Monitor your feet and legs for wounds daily.  If you notice wounds, please let us know.  Even small cuts can take longer to heal with diabetes and can result in significant infection Wear supportive shoes and avoid walking barefoot to best protect your feet.   DIABETES High blood sugar can damage your organs, blood vessels, and nerves, slow wound healing, and increase your risk of infection, among many other things.  The risk of having a heart attack and/or stroke is [facility] higher if you have diabetes with uncontrolled blood sugars.  To help reduce your risks and keep you healthy, we must work together to get your blood sugar levels under control with diet, exercise, and medication.    The most important and effective way to control diabetes are through diet changes and regular exercise.   What is Happening With Diabetes? Foods high in carbohydrates (sugars, starches, bread, pasta, potatoes, soda, fruit juices, etc) break down into a sugar called glucose once in your body. Glucose is used by the cells in your body for fuel to have the energy they need to work properly.  As the glucose is released into your blood stream during digestion, your blood sugar goes up. This is called hyperglycemia.   Insulin is a hormone in the body that works as a key to unlock the cell and allow the  glucose in.  Normally, insulin is released in response to rising blood glucose levels.   In people with diabetes, either the cells have changed the locks and don't open with the insulin key or there is not enough insulin made by the body to use all of the glucose in the blood.   This means that the glucose never makes it into the cells for fuel and stays in the blood. The high levels of glucose in the blood are like a poison to your  organs and blood vessels and over time permanent damage starts to occur.  What Kind of Diet is Best for Diabetes? A person with diabetes must limit the amount of carbohydrates and sugar eaten to help prevent high blood glucose levels.     You should aim for less than 1/2 of your total calorie intake per day to come from carbohydrates.  What this means is, if you eat a 1200-1500 calorie diet, you will want less than 600-750 of those calories to come from carbohydrates. That equals to about 150-200 grams of carbohydrates per day.   GOAL: Eat 1200-1500 calories a day with 150-200 grams of carbohydrates or less.   Reading labels is very important to help understand how many carbohydrates are in certain foods. There are also tables available online for restaurant foods that may help when you are eating out.   Important foods to INCREASE in your diet are lean meats, protein, and vegetables. It can be very helpful to measure the food you are eating by the recommended serving size on packages to make sure you are not overeating and monitor your calories and carbohydrates.   How Does Exercise Help with Diabetes? When you exercise, the cells in your body use up more energy. This means that they need more fuel to keep going. The cells that can still use the insulin key, take in more of the glucose from the blood for energy and the blood glucose levels go down.   Excess fat cells can be the cause of the insulin key no longer working. Exercise and weight loss can change the locks back to allow the insulin key to work again.   Enough weight loss can sometimes get rid of diabetes!  What Kind of Exercise is Best for Diabetes? I recommend starting out with moderate exercise, like walking.  Walking every single day for at least 15-20 minutes can be enough to get you up and moving without wearing you out.  You want to walk at a pace that you can carry on a conversation without being too out of breath, but that  you also get your heart rate up and break a sweat.  As you get used to daily walking, you should increase how far, how fast, and how long you walk.    Monitoring your blood sugars helps you have an understanding of how your diet and activity levels are affecting your blood sugar. Certain foods that you think may not increase your blood sugar really make a difference. By monitoring your sugar when you eat, you can see how different foods affect your numbers. This is very important when you first start treatment to get a good understanding of what foods are good and what foods you should limit.   I would like you to monitor your blood sugar every morning before eating and write down the number to bring with you to your next visit. This will help [place] determine if we need to make changes to the  medication and diet.   You may also want to check your blood sugar after meals to see how certain foods are affecting the numbers. Excellent blood sugar goals are between 90-120 when you have not eaten and less than 160 1-2 hours after a meal. If you are checking your blood sugars 1-2 hours after a meal and they are higher than this, this tells [place] that we need better control. If you are waking up with blood sugars above 120, we need to look at your diet the day before to see if this could be affecting the numbers.   Medication is the key to help control your blood sugars. With diabetes, your body is not using insulin like it should to help the glucose (sugar) get into the cells for fuel. Medications help your body produce more insulin and make your cells more receptive to insulin so that the glucose in your body can be used instead of remaining in the blood.  The first line medication is called Metformin. This is a pill that you take daily, usually twice a day, to help your body properly use glucose. If good control is not achieved with metformin, other medications can be added or changed to help with better control.    Management of cholesterol is vital to reduce your risks of heart attack and stroke. Medication to control your cholesterol is also very important and necessary for your overall health.  As a new diabetic, we will plan to follow-up every 3 months to check your hemoglobin A1c, which gives me an average of your blood sugars over the past 3 months to help determine your control. Once your blood sugars are well controlled, we can go down to checking every 6 months.  We also need to closely monitor your feet, kidney function, and cholesterol as these are all affected from diabetes.

## 2020-11-08 NOTE — Assessment & Plan Note (Signed)
BP slightly elevated in office today At home readings in 120's/70's No red flags Given the recent new dx of DM, BP likely elevated d/t anxiety factors today Continue to monitor at home and f/u as needed for readings consistently >140/80 for 2 days in a row F/U in 3 months for labs

## 2020-11-08 NOTE — Assessment & Plan Note (Signed)
New dx Start atorvastatin 10mg  qHS Family hx of being unable to tolerate statins- trial to see how she does on low dose before escalating Diet and exercise management excellent- continue New DM management started today Education on diet and exercise recommendations discussed and provided on handout F/U in 3 months for labs

## 2020-11-08 NOTE — Assessment & Plan Note (Signed)
New dx Education provided today on new dx, diet, exercise, blood glucose checking, and lifestyle changes Recommendations and resources discussed Handout on new dx provided for home study Start metformin 500mg  BID, atorvastatin 10mg  qHS, and qAM blood sugar monitoring with fasting goal 70-110 F/U in 3 months for labs, foot exam, and urine micro

## 2020-11-16 ENCOUNTER — Encounter: Payer: Self-pay | Admitting: Physician Assistant

## 2020-11-17 ENCOUNTER — Encounter: Payer: Self-pay | Admitting: Physician Assistant

## 2020-11-19 ENCOUNTER — Other Ambulatory Visit: Payer: BC Managed Care – PPO

## 2020-12-14 ENCOUNTER — Ambulatory Visit: Payer: BC Managed Care – PPO | Admitting: Physician Assistant

## 2020-12-14 ENCOUNTER — Encounter: Payer: Self-pay | Admitting: Physician Assistant

## 2020-12-14 ENCOUNTER — Other Ambulatory Visit (INDEPENDENT_AMBULATORY_CARE_PROVIDER_SITE_OTHER): Payer: BC Managed Care – PPO

## 2020-12-14 VITALS — BP 132/76 | HR 90 | Ht 65.0 in | Wt 278.0 lb

## 2020-12-14 DIAGNOSIS — D509 Iron deficiency anemia, unspecified: Secondary | ICD-10-CM

## 2020-12-14 LAB — CBC WITH DIFFERENTIAL/PLATELET
Basophils Absolute: 0.1 10*3/uL (ref 0.0–0.1)
Basophils Relative: 0.7 % (ref 0.0–3.0)
Eosinophils Absolute: 0.1 10*3/uL (ref 0.0–0.7)
Eosinophils Relative: 0.9 % (ref 0.0–5.0)
HCT: 32.6 % — ABNORMAL LOW (ref 36.0–46.0)
Hemoglobin: 10.8 g/dL — ABNORMAL LOW (ref 12.0–15.0)
Lymphocytes Relative: 21.6 % (ref 12.0–46.0)
Lymphs Abs: 2 10*3/uL (ref 0.7–4.0)
MCHC: 33.2 g/dL (ref 30.0–36.0)
MCV: 82.9 fl (ref 78.0–100.0)
Monocytes Absolute: 0.5 10*3/uL (ref 0.1–1.0)
Monocytes Relative: 5 % (ref 3.0–12.0)
Neutro Abs: 6.7 10*3/uL (ref 1.4–7.7)
Neutrophils Relative %: 71.8 % (ref 43.0–77.0)
Platelets: 416 10*3/uL — ABNORMAL HIGH (ref 150.0–400.0)
RBC: 3.93 Mil/uL (ref 3.87–5.11)
RDW: 16.9 % — ABNORMAL HIGH (ref 11.5–15.5)
WBC: 9.3 10*3/uL (ref 4.0–10.5)

## 2020-12-14 MED ORDER — NA SULFATE-K SULFATE-MG SULF 17.5-3.13-1.6 GM/177ML PO SOLN: 1.0000 | Freq: Once | ORAL | 0 refills | Status: AC

## 2020-12-14 NOTE — Progress Notes (Signed)
Reviewed and agree with management plans. ? ?Corben Auzenne L. Quinterious Walraven, MD, MPH  ?

## 2020-12-14 NOTE — Patient Instructions (Signed)
If you are age 51 or younger, your body mass index should be between 19-25. Your Body mass index is 46.26 kg/m. If this is out of the aformentioned range listed, please consider follow up with your Primary Care Provider.  __________________________________________________________  The [facility] providers would like to encourage you to use [facility] to communicate with providers for non-urgent requests or questions.  Due to long hold times on the telephone, sending your provider a message by [facility] may be a faster and more efficient way to get a response.  Please allow 48 business hours for a response.  Please remember that this is for non-urgent requests.   You have been scheduled for an endoscopy and colonoscopy. Please follow the written instructions given to you at your visit today. Please pick up your prep supplies at the pharmacy within the next 1-3 days. If you use inhalers (even only as needed), please bring them with you on the day of your procedure.  Your provider has requested that you go to the basement level for lab work before leaving today. Press "B" on the elevator. The lab is located at the first door on the left as you exit the elevator.  Increase your Iron supplement to twice daily with food.  Follow up pending the results of your Endoscopy and Colonoscopy or as needed.  Thank you for entrusting me with your care and choosing [facility].  [name], PA-C

## 2020-12-14 NOTE — Progress Notes (Signed)
Subjective:    Patient ID: Samantha Simpson, female    DOB: [birth year], 50 y.o.   MRN: [id number]  HPI Samantha Simpson is a pleasant 50 year old female, new to GI today referred by Samantha Spring NP/family medicine for evaluation of new diagnosis of iron deficiency anemia. Patient has not had any prior GI evaluation.  To her knowledge she has not had previous issues with anemia or iron deficiency. Most recent labs 10/12/2020 hemoglobin 10.2/hematocrit of 32.0/MCV 82 Ferritin 9/serum iron 38/TIBC 326-iron saturation 12  Reviewing her record she had a hemoglobin of 10 and 2017, hemoglobin was normal in 2013. Patient says generally speaking she has had normal menses without prior issues with menorrhagia though she did have 2 more prolonged menstrual periods last year. She has no complaints currently of abdominal pain or discomfort, no changes in bowel habits, no melena or hematochezia.  She has been taking Metamucil and says that she is having good bowel movements which are very consistent. No complaints of heartburn or indigestion no dysphagia.  Energy level has been only fair over the past couple of years. No regular aspirin or NSAIDs. Family history is pertinent for an aunt with colon cancer diagnosed in her 57s and brother with colon polyps. Other medical issues include hypertension, adult onset diabetes mellitus, hyperlipidemia and morbid obesity with BMI of 46.2  Review of Systems Pertinent positive and negative review of systems were noted in the above HPI section.  All other review of systems was otherwise negative.   Outpatient Encounter Medications as of 12/14/2020  Medication Sig   ACCU-CHEK GUIDE test strip 1 each by Other route QID.   Accu-Chek Softclix Lancets lancets 1 each by Other route QID.   amLODipine (NORVASC) 10 MG tablet Take 1 tablet (10 mg total) by mouth daily.   atorvastatin (LIPITOR) 10 MG tablet Take 1 tablet (10 mg total) by mouth daily. Take at bedtime.   blood glucose  meter kit and supplies KIT Dispense based on patient and insurance preference. Use up to four times daily as directed. Please include lancets, test strips, control solution. Dx: E11.65   DENTA 5000 PLUS 1.1 % CREA dental cream Take by mouth.   ferrous sulfate 325 (65 FE) MG EC tablet Take 1 tablet (325 mg total) by mouth daily with breakfast. Take with food to avoid stomach upset.   metFORMIN (GLUCOPHAGE) 500 MG tablet Take 1 tab (53m) by mouth with dinner for 5 days then increase to 1 tab (50101m with breakfast and 1 tab (50056mwith dinner.   Na Sulfate-K Sulfate-Mg Sulf 17.5-3.13-1.6 GM/177ML SOLN Take 1 kit by mouth once for 1 dose.   Psyllium (METAMUCIL) 0.36 g CAPS 1 each by Other route 2 (two) times daily.   valsartan-hydrochlorothiazide (DIOVAN-HCT) 160-25 MG tablet Take 1 tablet by mouth daily.   Facility-Administered Encounter Medications as of 12/14/2020  Medication   cloNIDine (CATAPRES) tablet 0.2 mg   Allergies  Allergen Reactions   Penicillins Rash    Has patient had a PCN reaction causing immediate rash, facial/tongue/throat swelling, SOB or lightheadedness with hypotension: Yes Has patient had a PCN reaction causing severe rash involving mucus membranes or skin necrosis: No Has patient had a PCN reaction that required hospitalization: No Has patient had a PCN reaction occurring within the last 10 years: No If all of the above answers are "NO", then may proceed with Cephalosporin use. 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 with goal blood pressure less than 130/80 11/08/2020   Anemia 11/08/2020   Breast lump or mass 10/12/2020   Encounter to establish care 08/26/2020   Hypertension  08/26/2020   Obesity 10/19/2011   ASCUS with positive high risk HPV 10/18/2008   Social History   Socioeconomic History   Marital status: Married    Spouse name: Not on file   Number of children: Not on file   Years of education: Not on file   Highest education level: Not on file  Occupational History   Not on file  Tobacco Use   Smoking status: Never   Smokeless tobacco: Never  Substance and Sexual Activity   Alcohol use: No   Drug use: No   Sexual activity: Yes    Birth control/protection: None  Other Topics Concern   Not on file  Social History Narrative   Not on file   Social Determinants of Health   Financial Resource Strain: Not on file  Food Insecurity: Not on file  Transportation Needs: Not on file  Physical Activity: Not on file  Stress: Not on file  Social Connections: Not on file  Intimate Partner Violence: Not on file    Samantha Simpson's family history includes Diabetes in her mother; Hypertension in her mother.      Objective:    Vitals:   12/14/20 0840  BP: 132/76  Pulse: 90    Physical Exam Well-developed well-nourished  female in no acute distress.  Height, Weight, 278 BMI 46.2  HEENT; nontraumatic normocephalic, EOMI, PE R LA, sclera anicteric. Oropharynx; not examined today Neck; supple, no JVD Cardiovascular; regular rate and rhythm with S1-S2, no murmur rub or gallop Pulmonary; Clear bilaterally Abdomen; soft, obese nontender, nondistended, no palpable mass or hepatosplenomegaly, bowel sounds are active Rectal; not done today Skin; benign exam, no jaundice rash or appreciable lesions Extremities; no clubbing cyanosis or edema skin warm and dry Neuro/Psych; alert and oriented x4, grossly nonfocal mood and affect appropriate        Assessment & Plan:   #7 50 year old female with new diagnosis of iron deficiency anemia.  Asymptomatic from a GI perspective. Rule out chronic occult GI blood loss, consider chronic gastropathy, AVMs,  occult neoplasm  #2 family history of colon cancer in an aunt diagnosed in her early 98s/brother with colon polyps 3.  Adult onset diabetes mellitus 4.  Hypertension 5.  Morbid obesity/BMI 46.2 6.  Hyperlipidemia  Plan; continue ferrous sulfate but increased to 325 mg p.o. twice daily Check CBC today Patient should have follow-up iron studies in 3 months.  She is scheduled to see her PCP in November. Patient will be scheduled for colonoscopy and EGD with Dr. Tarri Glenn.  Both procedures were discussed in detail with the patient including indications risk and benefits and she is agreeable to proceed. We briefly discussed capsule endoscopy should endoscopic evaluation be negative. As of today's visit patient is appropriate for endoscopic evaluation in the ambulatory care setting.  Further recommendations pending above.   Amy S Esterwood PA-C 12/14/2020   Cc: Orma Render, NP

## 2020-12-16 ENCOUNTER — Other Ambulatory Visit: Payer: Self-pay

## 2020-12-16 MED ORDER — GOLYTELY 236 G PO SOLR: 4000.0000 mL | ORAL | 0 refills | Status: DC

## 2021-01-06 ENCOUNTER — Encounter: Payer: Self-pay | Admitting: Gastroenterology

## 2021-01-13 ENCOUNTER — Ambulatory Visit (AMBULATORY_SURGERY_CENTER): Payer: BC Managed Care – PPO | Admitting: Gastroenterology

## 2021-01-13 ENCOUNTER — Other Ambulatory Visit: Payer: Self-pay

## 2021-01-13 ENCOUNTER — Encounter: Payer: Self-pay | Admitting: Gastroenterology

## 2021-01-13 VITALS — BP 124/83 | HR 70 | Temp 98.2°F | Resp 13 | Ht 65.0 in | Wt 278.0 lb

## 2021-01-13 DIAGNOSIS — B9681 Helicobacter pylori [H. pylori] as the cause of diseases classified elsewhere: Secondary | ICD-10-CM

## 2021-01-13 DIAGNOSIS — K2970 Gastritis, unspecified, without bleeding: Secondary | ICD-10-CM

## 2021-01-13 DIAGNOSIS — K2950 Unspecified chronic gastritis without bleeding: Secondary | ICD-10-CM | POA: Diagnosis not present

## 2021-01-13 DIAGNOSIS — D509 Iron deficiency anemia, unspecified: Secondary | ICD-10-CM

## 2021-01-13 MED ORDER — SODIUM CHLORIDE 0.9 % IV SOLN: 500.0000 mL | Freq: Once | INTRAVENOUS | Status: DC

## 2021-01-13 NOTE — Op Note (Addendum)
[facility] Patient Name: Samantha Simpson Procedure Date: 01/13/2021 2:36 PM MRN: [id number] Endoscopist: Tressia Danas MD, MD Age: 49 Referring MD:  Date of Birth: 1971-04-07 Gender: Female Account #: [id number] Procedure:                Colonoscopy Indications:              Unexplained iron deficiency anemia without overt                            bleeding                           Non-diagnostic EGD earlier today                           Brother with colon polyps                           Maternal aunt with colon cancer at age 60 Medicines:                Monitored Anesthesia Care Procedure:                Pre-Anesthesia Assessment:                           - Prior to the procedure, a History and Physical                            was performed, and patient medications and                            allergies were reviewed. The patient's tolerance of                            previous anesthesia was also reviewed. The risks                            and benefits of the procedure and the sedation                            options and risks were discussed with the patient.                            All questions were answered, and informed consent                            was obtained. Prior Anticoagulants: The patient has                            taken no previous anticoagulant or antiplatelet                            agents. ASA Grade Assessment: III - A patient with                            severe  systemic disease. After reviewing the risks                            and benefits, the patient was deemed in                            satisfactory condition to undergo the procedure.                           - Prior to the procedure, a History and Physical                            was performed, and patient medications and                            allergies were reviewed. The patient's tolerance of                            previous anesthesia was  also reviewed. The risks                            and benefits of the procedure and the sedation                            options and risks were discussed with the patient.                            All questions were answered, and informed consent                            was obtained. Prior Anticoagulants: The patient has                            taken no previous anticoagulant or antiplatelet                            agents. ASA Grade Assessment: III - A patient with                            severe systemic disease. After reviewing the risks                            and benefits, the patient was deemed in                            satisfactory condition to undergo the procedure.                           After obtaining informed consent, the colonoscope                            was passed under direct vision. Throughout the  procedure, the patient's blood pressure, pulse, and                            oxygen saturations were monitored continuously. The                            Olympus CF-HQ190L [id number] [id number]) Colonoscope was                            introduced through the anus and advanced to the 3                            cm into the ileum. A second forward view of the                            right colon was performed. The colonoscopy was                            performed without difficulty. The patient tolerated                            the procedure well. The quality of the bowel                            preparation was good. The terminal ileum, ileocecal                            valve, appendiceal orifice, and rectum were                            photographed. Scope In: 2:39:12 PM Scope Out: 2:54:29 PM Scope Withdrawal Time: 0 hours 12 minutes 18 seconds  Total Procedure Duration: 0 hours 15 minutes 17 seconds  Findings:                 The perianal and digital rectal examinations were                            normal.                            The entire examined colon appeared normal.                           The terminal ileum appeared normal.                           The exam was otherwise without abnormality on                            direct and retroflexion views. Complications:            No immediate complications. Estimated Blood Loss:     Estimated blood loss: none. Impression:               - Normal exam. No source for iron deficiency  identified. Recommendation:           - Patient has a contact number available for                            emergencies. The signs and symptoms of potential                            delayed complications were discussed with the                            patient. Return to normal activities tomorrow.                            Written discharge instructions were provided to the                            patient.                           - Resume previous diet.                           - Continue present medications including iron                            supplements.                           - Repeat colonoscopy in 5 years for surveillance                            given the family history, earlier with new symptoms.                           - Emerging evidence supports eating a diet of                            fruits, vegetables, grains, calcium, and yogurt                            while reducing red meat and alcohol may reduce the                            risk of colon cancer.                           - Consider evaluation of small bowel with anemia                            not responding to iron replacement or the                            development off occult or overt GI bleeding.                           -  Thank you for allowing me to be involved in your                            colon cancer prevention. Tressia Danas MD, MD 01/13/2021 3:01:46 PM This report has been signed electronically.

## 2021-01-13 NOTE — Patient Instructions (Addendum)
Continue present medications, including iron supplements.   Repeat colonoscopy in 5 years for surveillance given the family history, earlier with new symptoms.   YOU HAD AN ENDOSCOPIC PROCEDURE TODAY AT THE [facility]:   Refer to the procedure report that was given to you for any specific questions about what was found during the examination.  If the procedure report does not answer your questions, please call your gastroenterologist to clarify.  If you requested that your care partner not be given the details of your procedure findings, then the procedure report has been included in a sealed envelope for you to review at your convenience later.  YOU SHOULD EXPECT: Some feelings of bloating in the abdomen. Passage of more gas than usual.  Walking can help get rid of the air that was put into your GI tract during the procedure and reduce the bloating. If you had a lower endoscopy (such as a colonoscopy or flexible sigmoidoscopy) you may notice spotting of blood in your stool or on the toilet paper. If you underwent a bowel prep for your procedure, you may not have a normal bowel movement for a few days.  Please Note:  You might notice some irritation and congestion in your nose or some drainage.  This is from the oxygen used during your procedure.  There is no need for concern and it should clear up in a day or so.  SYMPTOMS TO REPORT IMMEDIATELY:  Following lower endoscopy (colonoscopy or flexible sigmoidoscopy):  Excessive amounts of blood in the stool  Significant tenderness or worsening of abdominal pains  Swelling of the abdomen that is new, acute  Fever of 100F or higher  Following upper endoscopy (EGD)  Vomiting of blood or coffee ground material  New chest pain or pain under the shoulder blades  Painful or persistently difficult swallowing  New shortness of breath  Fever of 100F or higher  Black, tarry-looking stools  For urgent or emergent issues, a  gastroenterologist can be reached at any hour by calling [phone]. Do not use MyChart messaging for urgent concerns.    DIET:  We do recommend a small meal at first, but then you may proceed to your regular diet.  Drink plenty of fluids but you should avoid alcoholic beverages for 24 hours.  ACTIVITY:  You should plan to take it easy for the rest of today and you should NOT DRIVE or use heavy machinery until tomorrow (because of the sedation medicines used during the test).    FOLLOW UP: Our staff will call the number listed on your records 48-72 hours following your procedure to check on you and address any questions or concerns that you may have regarding the information given to you following your procedure. If we do not reach you, we will leave a message.  We will attempt to reach you two times.  During this call, we will ask if you have developed any symptoms of COVID 19. If you develop any symptoms (ie: fever, flu-like symptoms, shortness of breath, cough etc.) before then, please call [phone].  If you test positive for Covid 19 in the 2 weeks post procedure, please call and report this information to [place].    If any biopsies were taken you will be contacted by phone or by letter within the next 1-3 weeks.  Please call us at [phone] if you have not heard about the biopsies in 3 weeks.    SIGNATURES/CONFIDENTIALITY: You and/or your care partner have signed  paperwork which will be entered into your electronic medical record.  These signatures attest to the fact that that the information above on your After Visit Summary has been reviewed and is understood.  Full responsibility of the confidentiality of this discharge information lies with you and/or your care-partner.

## 2021-01-13 NOTE — Progress Notes (Signed)
Pt in recovery with monitors in place, VSS. Report given to receiving RN. Bite guard was placed with pt awake to ensure comfort. No dental or soft tissue damage noted. 

## 2021-01-13 NOTE — Op Note (Signed)
[facility] Patient Name: Samantha Simpson Procedure Date: 01/13/2021 2:14 PM MRN: [id number] Endoscopist: Tressia Danas MD, MD Age: 50 Referring MD:  Date of Birth: [birth year] Gender: Female Account #: [id number] Procedure:                Upper GI endoscopy Indications:              Unexplained iron deficiency anemia                           No overt GI blood loss Medicines:                Monitored Anesthesia Care Procedure:                Pre-Anesthesia Assessment:                           - Prior to the procedure, a History and Physical                            was performed, and patient medications and                            allergies were reviewed. The patient's tolerance of                            previous anesthesia was also reviewed. The risks                            and benefits of the procedure and the sedation                            options and risks were discussed with the patient.                            All questions were answered, and informed consent                            was obtained. Prior Anticoagulants: The patient has                            taken no previous anticoagulant or antiplatelet                            agents. ASA Grade Assessment: III - A patient with                            severe systemic disease. After reviewing the risks                            and benefits, the patient was deemed in                            satisfactory condition to undergo the procedure.  After obtaining informed consent, the endoscope was                            passed under direct vision. Throughout the                            procedure, the patient's blood pressure, pulse, and                            oxygen saturations were monitored continuously. The                            Endoscope was introduced through the mouth, and                            advanced to the third part of duodenum.  The upper                            GI endoscopy was accomplished without difficulty.                            The patient tolerated the procedure well. Scope In: Scope Out: Findings:                 The esophagus was normal. The z-line is located 35                            cm from the incisors.                           The entire examined stomach was normal. Biopsies                            were taken from the antrum, body, and fundus with a                            cold forceps for histology. Estimated blood loss                            was minimal.                           The examined duodenum was normal. Biopsies were                            taken with a cold forceps for histology. Estimated                            blood loss was minimal.                           The cardia and gastric fundus were normal on                            retroflexion.  The exam was otherwise without abnormality. Complications:            No immediate complications. Estimated blood loss:                            Minimal. Estimated Blood Loss:     Estimated blood loss was minimal. Impression:               - Normal exam.                           - No obvious source for anemia identified on this                            exam. Await gastric and duodenal biopsies. Recommendation:           - Patient has a contact number available for                            emergencies. The signs and symptoms of potential                            delayed complications were discussed with the                            patient. Return to normal activities tomorrow.                            Written discharge instructions were provided to the                            patient.                           - Resume previous diet.                           - Continue present medications.                           - Await pathology results.                           -  Proceed with colonoscopy. Tressia Danas MD, MD 01/13/2021 2:37:53 PM This report has been signed electronically.

## 2021-01-13 NOTE — Progress Notes (Signed)
DT- VS ° °Pt's states no medical or surgical changes since previsit or office visit.  °

## 2021-01-13 NOTE — Progress Notes (Addendum)
Referring Provider: Orma Render, NP Primary Care Physician:  Orma Render, NP  Reason for Procedure:  Unexplained iron deficiency anemia   IMPRESSION:  Unexplained iron deficiency anemia Family history of colon polyps (brother) Family history of colon cancer (maternal aunt at age 51) Appropriate candidate for monitored anesthesia care  PLAN: EGD Colonoscopy in the [place] today   HPI: Samantha Simpson is a 50 y.o. female presents for endoscopic evaluation of iron deficiency anemia. Please see Samantha Simpson's outpatient note from 12/14/20 for full details. There is no significant change in history or physical exam since that visit.   Hemoglobin 10.0 10/25/20 Hemoglobin 10.8 12/14/20  Brother with colon polyps. Maternal Aunt with colon cancer in her early 55s. No other  known family history of colon cancer or polyps.    Past Medical History:  Diagnosis Date   Acute bronchitis 06/08/2016   ASCUS (atypical squamous cells of undetermined significance) on Pap smear 1997   BV (bacterial vaginosis) 2001   CIN I (cervical intraepithelial neoplasia I) 2004   Exposure to chlamydia, mucopurulent cervitis/nongonococcal urethritis 1997   H/O fatigue 2010   H/O: obesity 1997   HGSIL (high grade squamous intraepithelial dysplasia)    Increased BMI 2006   Ovarian cyst, left 1996   Renal stones    Routine gynecological examination 10/19/2011   Ureteral stone with hydronephrosis 02/24/2016    Past Surgical History:  Procedure Laterality Date   [facility]     CYSTOSCOPY WITH STENT PLACEMENT Left 02/23/2016   Procedure: CYSTOSCOPY WITH STENT PLACEMENT;  Surgeon: Raynelle Bring, MD;  Location: [facility];  Service: Urology;  Laterality: Left;   CYSTOSCOPY WITH URETEROSCOPY, STONE BASKETRY AND STENT PLACEMENT Left 03/13/2016   Procedure: CYSTOSCOPY WITH URETEROSCOPY, STONE BASKETRY AND  left ureter removal of stent;  Surgeon: Raynelle Bring, MD;  Location: [facility];  Service: Urology;  Laterality: Left;    LAPAROSCOPY  1999   left ovary    Current Outpatient Medications  Medication Sig Dispense Refill   ACCU-CHEK GUIDE test strip 1 each by Other route QID.     Accu-Chek Softclix Lancets lancets 1 each by Other route QID.     amLODipine (NORVASC) 10 MG tablet Take 1 tablet (10 mg total) by mouth daily. 90 tablet 3   atorvastatin (LIPITOR) 10 MG tablet Take 1 tablet (10 mg total) by mouth daily. Take at bedtime. 30 tablet 3   blood glucose meter kit and supplies KIT Dispense based on patient and insurance preference. Use up to four times daily as directed. Please include lancets, test strips, control solution. Dx: E11.65 1 each 99   DENTA 5000 PLUS 1.1 % CREA dental cream Take by mouth.     ferrous sulfate 325 (65 FE) MG EC tablet Take 1 tablet (325 mg total) by mouth daily with breakfast. Take with food to avoid stomach upset. 30 tablet 3   metFORMIN (GLUCOPHAGE) 500 MG tablet Take 1 tab (562m) by mouth with dinner for 5 days then increase to 1 tab (5064m with breakfast and 1 tab (50066mwith dinner. 180 tablet 1   polyethylene glycol (GOLYTELY) 236 g solution Take 4,000 mLs by mouth as directed. 4000 mL 0   Psyllium (METAMUCIL) 0.36 g CAPS 1 each by Other route 2 (two) times daily.     valsartan-hydrochlorothiazide (DIOVAN-HCT) 160-25 MG tablet Take 1 tablet by mouth daily. 90 tablet 3   Current Facility-Administered Medications  Medication Dose Route Frequency Provider Last Rate Last Admin  cloNIDine (CATAPRES) tablet 0.2 mg  0.2 mg Oral Once Early, Coralee Pesa, NP        Allergies as of 01/13/2021 - Review Complete 12/14/2020  Allergen Reaction Noted   Penicillins Rash 11/17/2002    Family History  Problem Relation Age of Onset   Diabetes Mother    Hypertension Mother      Physical Exam: General:   Alert,  well-nourished, pleasant and cooperative in NAD Head:  Normocephalic and atraumatic. Eyes:  Sclera clear, no icterus.   Conjunctiva pink. Mouth:  No deformity or lesions.    Neck:  Supple; no masses or thyromegaly. Lungs:  Clear throughout to auscultation.   No wheezes. Heart:  Regular rate and rhythm; no murmurs. Abdomen:  Soft, non-tender, nondistended, normal bowel sounds, no rebound or guarding.  Msk:  Symmetrical. No boney deformities LAD: No inguinal or umbilical LAD Extremities:  No clubbing or edema. Neurologic:  Alert and  oriented x4;  grossly nonfocal Skin:  No obvious rash or bruise. Psych:  Alert and cooperative. Normal mood and affect.    [name] L. Tarri Glenn, MD, MPH 01/13/2021, 1:32 PM

## 2021-01-13 NOTE — Progress Notes (Signed)
Called to room to assist during endoscopic procedure.  Patient ID and intended procedure confirmed with present staff. Received instructions for my participation in the procedure from the performing physician.  

## 2021-01-17 ENCOUNTER — Telehealth: Payer: Self-pay

## 2021-01-17 NOTE — Telephone Encounter (Signed)
  Follow up Call-  Call back number 01/13/2021  Post procedure Call Back phone  # [phone]  Permission to leave phone message Yes  Some recent data might be hidden     Patient questions:  Do you have a fever, pain , or abdominal swelling? No. Pain Score  0 *  Have you tolerated food without any problems? Yes.    Have you been able to return to your normal activities? Yes.    Do you have any questions about your discharge instructions: Diet   No. Medications  No. Follow up visit  No.  Do you have questions or concerns about your Care? No.  Actions: * If pain score is 4 or above: No action needed, pain <4.  Have you developed a fever since your procedure? No   2.   Have you had an respiratory symptoms (SOB or cough) since your procedure? No   3.   Have you tested positive for COVID 19 since your procedure no   4.   Have you had any family members/close contacts diagnosed with the COVID 19 since your procedure?  No   If yes to any of these questions please route to Laverna Peace, RN and Karlton Lemon, RN

## 2021-01-19 ENCOUNTER — Telehealth: Payer: Self-pay | Admitting: Gastroenterology

## 2021-01-19 ENCOUNTER — Other Ambulatory Visit: Payer: Self-pay

## 2021-01-19 DIAGNOSIS — A048 Other specified bacterial intestinal infections: Secondary | ICD-10-CM

## 2021-01-19 MED ORDER — BISMUTH SUBSALICYLATE 262 MG PO CHEW: 262.0000 mg | CHEWABLE_TABLET | Freq: Four times a day (QID) | ORAL | 0 refills | Status: AC

## 2021-01-19 MED ORDER — PANTOPRAZOLE SODIUM 40 MG PO TBEC: 40.0000 mg | DELAYED_RELEASE_TABLET | Freq: Two times a day (BID) | ORAL | 3 refills | Status: DC

## 2021-01-19 MED ORDER — TETRACYCLINE HCL 500 MG PO CAPS: 500.0000 mg | ORAL_CAPSULE | Freq: Four times a day (QID) | ORAL | 0 refills | Status: AC

## 2021-01-19 MED ORDER — METRONIDAZOLE 500 MG PO TABS: 500.0000 mg | ORAL_TABLET | Freq: Four times a day (QID) | ORAL | 0 refills | Status: AC

## 2021-01-19 NOTE — Telephone Encounter (Signed)
Addressed in a previously created encounter 

## 2021-01-19 NOTE — Telephone Encounter (Signed)
Patient returned your phone call.  Thank you. °

## 2021-01-23 DIAGNOSIS — Z23 Encounter for immunization: Secondary | ICD-10-CM | POA: Diagnosis not present

## 2021-02-03 ENCOUNTER — Other Ambulatory Visit: Payer: Self-pay

## 2021-02-03 MED ORDER — FLUCONAZOLE 150 MG PO TABS: ORAL_TABLET | ORAL | 0 refills | Status: DC

## 2021-02-07 NOTE — Progress Notes (Deleted)
   Established Patient Office Visit  Subjective:  Patient ID: Samantha Simpson, female    DOB: 23-Jun-1970  Age: 50 y.o. MRN: [id number]  CC: No chief complaint on file.   HPI Samantha Simpson presents for ***   ROS Review of Systems All review of systems negative except what is listed in the HPI    Objective:    Physical Exam  There were no vitals taken for this visit. Wt Readings from Last 3 Encounters:  01/13/21 278 lb (126.1 kg)  12/14/20 278 lb (126.1 kg)  11/08/20 287 lb 6.4 oz (130.4 kg)     Health Maintenance Due  Topic Date Due   Pneumococcal Vaccine 63-55 Years old (1 - PCV) Never done   FOOT EXAM  Never done   OPHTHALMOLOGY EXAM  Never done   HIV Screening  Never done   Hepatitis C Screening  Never done   TETANUS/TDAP  Never done   PAP SMEAR-Modifier  10/19/2014   COVID-19 Vaccine (3 - Booster for Moderna series) 12/19/2019   Zoster Vaccines- Shingrix (1 of 2) Never done   INFLUENZA VACCINE  Never done     Assessment & Plan:   Problem List Items Addressed This Visit   None   No orders of the defined types were placed in this encounter.   Follow-up: No follow-ups on file.    Tollie Eth, NP

## 2021-02-08 ENCOUNTER — Ambulatory Visit (HOSPITAL_BASED_OUTPATIENT_CLINIC_OR_DEPARTMENT_OTHER): Payer: BC Managed Care – PPO | Admitting: Nurse Practitioner

## 2021-02-08 ENCOUNTER — Encounter (HOSPITAL_BASED_OUTPATIENT_CLINIC_OR_DEPARTMENT_OTHER): Payer: Self-pay | Admitting: Nurse Practitioner

## 2021-02-08 ENCOUNTER — Other Ambulatory Visit: Payer: Self-pay

## 2021-02-08 ENCOUNTER — Other Ambulatory Visit: Payer: BC Managed Care – PPO

## 2021-02-08 VITALS — BP 126/90 | HR 92 | Ht 65.0 in | Wt 275.2 lb

## 2021-02-08 DIAGNOSIS — E1169 Type 2 diabetes mellitus with other specified complication: Secondary | ICD-10-CM | POA: Diagnosis not present

## 2021-02-08 DIAGNOSIS — E785 Hyperlipidemia, unspecified: Secondary | ICD-10-CM

## 2021-02-08 DIAGNOSIS — I152 Hypertension secondary to endocrine disorders: Secondary | ICD-10-CM

## 2021-02-08 DIAGNOSIS — E1165 Type 2 diabetes mellitus with hyperglycemia: Secondary | ICD-10-CM | POA: Diagnosis not present

## 2021-02-08 DIAGNOSIS — Z6841 Body Mass Index (BMI) 40.0 and over, adult: Secondary | ICD-10-CM

## 2021-02-08 DIAGNOSIS — E349 Endocrine disorder, unspecified: Secondary | ICD-10-CM

## 2021-02-08 LAB — HEMOGLOBIN A1C
Est. average glucose Bld gHb Est-mCnc: 140 mg/dL
Hgb A1c MFr Bld: 6.5 % — ABNORMAL HIGH (ref 4.8–5.6)

## 2021-02-08 MED ORDER — TIRZEPATIDE 7.5 MG/0.5ML ~~LOC~~ SOAJ: 7.5000 mg | SUBCUTANEOUS | 0 refills | Status: DC

## 2021-02-08 MED ORDER — AMBULATORY NON FORMULARY MEDICATION: 11 refills | Status: DC

## 2021-02-08 MED ORDER — TIRZEPATIDE 5 MG/0.5ML ~~LOC~~ SOAJ: 5.0000 mg | SUBCUTANEOUS | 0 refills | Status: DC

## 2021-02-08 NOTE — Assessment & Plan Note (Signed)
BP well controlled today.  Doing great working on increased activity and weight loss efforts.  No changes to medications at this time.  Recent labs reviewed from GI.

## 2021-02-08 NOTE — Assessment & Plan Note (Signed)
Initial myalgias have improved and she is doing well on medication We will wait to check lipids in 3 months with next labs Starting moujaro today. Continue to exercise and monitor diet.  We will follow-up in 3 months.

## 2021-02-08 NOTE — Assessment & Plan Note (Addendum)
Blood sugars remain elevated despite diet and activity changes. Tolerating metformin well. Plan to continue at this time. Will start mounjaro today in effort to obtain better control of blood sugars and co-morbid conditions.  Patient has successfully lost about 20 lbs on her own so far.  Sample and coupon code provided today.  Patient educated on medication use and monitoring for SE.  She has had dilated eye exam earlier this year- we will request records for that.  She is due for pna and shingrix vaccine, she will consider these.  Will f/u in 3 months or sooner if needed.

## 2021-02-08 NOTE — Assessment & Plan Note (Deleted)
BP well controlled today.  Doing great working on increased activity and weight loss efforts.  No changes to medications at this time.  Recent labs reviewed from GI.

## 2021-02-08 NOTE — Assessment & Plan Note (Signed)
Working on diet and exercise Successful weight loss of about 20 lbs so far.  Continue diet and exercise. Great job!! Will start mounjaro for additional control of DM and co-morbid conditions.  F/U in 3 months or sooner if needed.

## 2021-02-08 NOTE — Progress Notes (Signed)
Acute Office Visit  Subjective:    Patient ID: Samantha Simpson, female    DOB: September 21, 1970, 50 y.o.   MRN: [id number]  Chief Complaint  Patient presents with   Follow-up    DM and Anemia. Patient had labs with Dr. Christain Simpson (GI) in September. She also had colonoscopy and endoscopy completed on 10/06. She has no concerns or complaints today    HPI Patient is in today for a routine follow up.   Anemia: Has a history of anemia. Currently taking Iron, twice a day. Last CBC was in September, 2022 and her hemoglobin was 10.8, which has improved from 10.0 in July, 2022. She denies any lightheadedness, dizziness or any fatigue.  Diabetes Mellitus: Has been monitoring her blood sugars at home. Her fasting blood glucose range has been between 108 to upper 160s. She does report eating a meal with more carbs on nights prior to her higher fast blood glucose. She has been exercising 4-5 times a week for 30 mins a day on her elliptical. She reports she has lost 20 lbs since her last visit. She is interested is trying to continuous blood glucose monitoring device. She denies any hypoglycemic events or symptoms. Currently taking metformin and tolerating well with no adverse side effects. No vision changes or paresthesias.   Review of Systems  All other systems reviewed and are negative.     Objective:    Physical Exam Vitals and nursing note reviewed.  Cardiovascular:     Rate and Rhythm: Normal rate and regular rhythm.     Pulses: Normal pulses.     Heart sounds: Normal heart sounds.  Pulmonary:     Effort: Pulmonary effort is normal.     Breath sounds: Normal breath sounds.  Neurological:     Mental Status: She is oriented to person, place, and time.  Psychiatric:        Mood and Affect: Mood normal.        Behavior: Behavior normal.    BP 126/90   Pulse 92   Ht 5\' 5"  (1.651 m)   Wt 275 lb 3.2 oz (124.8 kg)   SpO2 98%   BMI 45.80 kg/m  Wt Readings from Last 3 Encounters:   02/08/21 275 lb 3.2 oz (124.8 kg)  01/13/21 278 lb (126.1 kg)  12/14/20 278 lb (126.1 kg)    Health Maintenance Due  Topic Date Due   Pneumococcal Vaccine 36-10 Years old (1 - PCV) Never done   FOOT EXAM  Never done   OPHTHALMOLOGY EXAM  Never done   TETANUS/TDAP  Never done   PAP SMEAR-Modifier  10/19/2014   COVID-19 Vaccine (3 - Booster for Moderna series) 12/19/2019   Zoster Vaccines- Shingrix (1 of 2) Never done   INFLUENZA VACCINE  Never done    There are no preventive care reminders to display for this patient.     Assessment & Plan:   Problem List Items Addressed This Visit     Obesity    Working on diet and exercise Successful weight loss of about 20 lbs so far.  Continue diet and exercise. Great job!! Will start mounjaro for additional control of DM and co-morbid conditions.  F/U in 3 months or sooner if needed.       Relevant Medications   tirzepatide (MOUNJARO) 5 MG/0.5ML Pen   tirzepatide (MOUNJARO) 7.5 MG/0.5ML Pen   Type 2 diabetes mellitus with hyperglycemia, without long-term current use of insulin (HCC) - Primary    Blood  sugars remain elevated despite diet and activity changes. Tolerating metformin well. Plan to continue at this time. Will start mounjaro today in effort to obtain better control of blood sugars and co-morbid conditions.  Patient has successfully lost about 20 lbs on her own so far.  Sample and coupon code provided today.  Patient educated on medication use and monitoring for SE.  She has had dilated eye exam earlier this year- we will request records for that.  She is due for pna and shingrix vaccine, she will consider these.  Will f/u in 3 months or sooner if needed.       Relevant Medications   tirzepatide (MOUNJARO) 5 MG/0.5ML Pen   tirzepatide (MOUNJARO) 7.5 MG/0.5ML Pen   AMBULATORY NON FORMULARY MEDICATION   Other Relevant Orders   Hemoglobin A1c   Hyperlipidemia associated with type 2 diabetes mellitus (HCC)    Initial  myalgias have improved and she is doing well on medication We will wait to check lipids in 3 months with next labs Starting moujaro today. Continue to exercise and monitor diet.  We will follow-up in 3 months.       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  Doing great working on increased activity and weight loss efforts.  No changes to medications at this time.  Recent labs reviewed from GI.       Relevant Medications   tirzepatide (MOUNJARO) 5 MG/0.5ML Pen   tirzepatide (MOUNJARO) 7.5 MG/0.5ML Pen   AMBULATORY NON FORMULARY MEDICATION   Other Relevant Orders   Hemoglobin A1c     Meds ordered this encounter  Medications   tirzepatide (MOUNJARO) 5 MG/0.5ML Pen    Sig: Inject 5 mg into the skin once a week.    Dispense:  2 mL    Refill:  0   tirzepatide (MOUNJARO) 7.5 MG/0.5ML Pen    Sig: Inject 7.5 mg into the skin once a week.    Dispense:  4 mL    Refill:  0    Coupon card for 3 months- please provide with 5mg  pen box for total of 84 days   AMBULATORY NON FORMULARY MEDICATION    Sig: Medication Name: Free Style Libre 3 sensor. No reader required. Dx Code: E11.65    Dispense:  2 Units    Refill:  11     05-10-1984, BSN-RN, DNP STUDENT    Patient seen along with NP student, Modesto Charon, RN, NP Student at today's visit. Portions of documentation and assessment have been completed by the student listed.  Modesto Charon, NP, have reviewed all documentation completed by the student. The documentation on 02/08/21 for the exam, diagnosis, procedures, and orders are all accurate and complete.  13/01/22, NP,have personally seen and evaluated the patient during this encounter.  While the patient was in  clinic, I reviewed the patient's medical history, the student's findings on physical examination, and the patient's diagnosis and treatment plan. All aspects of care were discussed with the the student and I agree with the information documented.   [name], Suzanna Obey, NP, DNp, AGNP-c Primary Care & Sports Medicine at [facility], [facility]   Diabetes education, medication education, device education, evaluation  provided today with 30+ minutes face to face time spent with patient for chronic disease management

## 2021-02-08 NOTE — Patient Instructions (Signed)
Starting [facility] today. We will recheck A1c in 3 months Sent in orders for 3 months and also the orders for the Free Style. If you have issues with the prescriptions, please let me know.

## 2021-02-08 NOTE — Assessment & Plan Note (Signed)
Hemoglobin has gotten better. Continue to take her Iron. She has a follow up with her GI in December, 2022.

## 2021-03-07 ENCOUNTER — Other Ambulatory Visit (HOSPITAL_BASED_OUTPATIENT_CLINIC_OR_DEPARTMENT_OTHER): Payer: Self-pay | Admitting: Nurse Practitioner

## 2021-03-07 DIAGNOSIS — E1169 Type 2 diabetes mellitus with other specified complication: Secondary | ICD-10-CM

## 2021-03-07 DIAGNOSIS — I152 Hypertension secondary to endocrine disorders: Secondary | ICD-10-CM

## 2021-03-07 DIAGNOSIS — E349 Endocrine disorder, unspecified: Secondary | ICD-10-CM

## 2021-03-07 DIAGNOSIS — E1165 Type 2 diabetes mellitus with hyperglycemia: Secondary | ICD-10-CM

## 2021-03-11 ENCOUNTER — Encounter (HOSPITAL_BASED_OUTPATIENT_CLINIC_OR_DEPARTMENT_OTHER): Payer: Self-pay | Admitting: Nurse Practitioner

## 2021-03-31 ENCOUNTER — Other Ambulatory Visit (HOSPITAL_BASED_OUTPATIENT_CLINIC_OR_DEPARTMENT_OTHER): Payer: Self-pay

## 2021-03-31 ENCOUNTER — Encounter (HOSPITAL_BASED_OUTPATIENT_CLINIC_OR_DEPARTMENT_OTHER): Payer: Self-pay | Admitting: Nurse Practitioner

## 2021-03-31 DIAGNOSIS — I152 Hypertension secondary to endocrine disorders: Secondary | ICD-10-CM

## 2021-03-31 DIAGNOSIS — I10 Essential (primary) hypertension: Secondary | ICD-10-CM

## 2021-03-31 DIAGNOSIS — Z6841 Body Mass Index (BMI) 40.0 and over, adult: Secondary | ICD-10-CM

## 2021-03-31 DIAGNOSIS — E785 Hyperlipidemia, unspecified: Secondary | ICD-10-CM

## 2021-03-31 DIAGNOSIS — E1165 Type 2 diabetes mellitus with hyperglycemia: Secondary | ICD-10-CM

## 2021-03-31 MED ORDER — TIRZEPATIDE 7.5 MG/0.5ML ~~LOC~~ SOAJ
7.5000 mg | SUBCUTANEOUS | 1 refills | Status: DC
  Filled 2021-03-31: qty 2, 28d supply, fill #0
  Filled 2021-05-06: qty 2, 28d supply, fill #1
  Filled 2021-06-03: qty 2, 28d supply, fill #2

## 2021-04-08 ENCOUNTER — Ambulatory Visit: Payer: BC Managed Care – PPO | Admitting: Gastroenterology

## 2021-04-14 ENCOUNTER — Ambulatory Visit (HOSPITAL_BASED_OUTPATIENT_CLINIC_OR_DEPARTMENT_OTHER): Payer: BC Managed Care – PPO | Admitting: Nurse Practitioner

## 2021-04-26 ENCOUNTER — Encounter: Payer: Self-pay | Admitting: Gastroenterology

## 2021-05-06 ENCOUNTER — Other Ambulatory Visit (HOSPITAL_BASED_OUTPATIENT_CLINIC_OR_DEPARTMENT_OTHER): Payer: Self-pay

## 2021-05-10 ENCOUNTER — Other Ambulatory Visit (HOSPITAL_BASED_OUTPATIENT_CLINIC_OR_DEPARTMENT_OTHER): Payer: Self-pay

## 2021-05-11 ENCOUNTER — Other Ambulatory Visit: Payer: Self-pay

## 2021-05-11 ENCOUNTER — Other Ambulatory Visit (HOSPITAL_BASED_OUTPATIENT_CLINIC_OR_DEPARTMENT_OTHER): Payer: Self-pay

## 2021-05-11 ENCOUNTER — Encounter (HOSPITAL_BASED_OUTPATIENT_CLINIC_OR_DEPARTMENT_OTHER): Payer: Self-pay | Admitting: Nurse Practitioner

## 2021-05-11 ENCOUNTER — Ambulatory Visit (HOSPITAL_BASED_OUTPATIENT_CLINIC_OR_DEPARTMENT_OTHER): Payer: BC Managed Care – PPO | Admitting: Nurse Practitioner

## 2021-05-11 VITALS — BP 117/72 | HR 75 | Ht 65.0 in | Wt 246.0 lb

## 2021-05-11 DIAGNOSIS — E1169 Type 2 diabetes mellitus with other specified complication: Secondary | ICD-10-CM

## 2021-05-11 DIAGNOSIS — I152 Hypertension secondary to endocrine disorders: Secondary | ICD-10-CM | POA: Diagnosis not present

## 2021-05-11 DIAGNOSIS — E1165 Type 2 diabetes mellitus with hyperglycemia: Secondary | ICD-10-CM | POA: Diagnosis not present

## 2021-05-11 DIAGNOSIS — K219 Gastro-esophageal reflux disease without esophagitis: Secondary | ICD-10-CM

## 2021-05-11 DIAGNOSIS — Z139 Encounter for screening, unspecified: Secondary | ICD-10-CM

## 2021-05-11 DIAGNOSIS — E785 Hyperlipidemia, unspecified: Secondary | ICD-10-CM | POA: Diagnosis not present

## 2021-05-11 DIAGNOSIS — Z6841 Body Mass Index (BMI) 40.0 and over, adult: Secondary | ICD-10-CM

## 2021-05-11 DIAGNOSIS — I10 Essential (primary) hypertension: Secondary | ICD-10-CM

## 2021-05-11 DIAGNOSIS — D649 Anemia, unspecified: Secondary | ICD-10-CM

## 2021-05-11 DIAGNOSIS — E349 Endocrine disorder, unspecified: Secondary | ICD-10-CM

## 2021-05-11 MED ORDER — PANTOPRAZOLE SODIUM 40 MG PO TBEC
40.0000 mg | DELAYED_RELEASE_TABLET | Freq: Two times a day (BID) | ORAL | 3 refills | Status: DC
  Filled 2021-05-11: qty 90, 45d supply, fill #0
  Filled 2021-10-24: qty 90, 45d supply, fill #1

## 2021-05-11 MED ORDER — FREESTYLE LIBRE 3 SENSOR MISC
1.0000 [IU] | 11 refills | Status: DC
  Filled 2021-05-11 – 2021-10-28 (×3): qty 2, 28d supply, fill #0

## 2021-05-11 MED ORDER — TIRZEPATIDE 7.5 MG/0.5ML ~~LOC~~ SOAJ
7.5000 mg | SUBCUTANEOUS | 1 refills | Status: DC
  Filled 2021-07-14: qty 2, 28d supply, fill #0
  Filled 2021-09-09: qty 2, 28d supply, fill #1
  Filled 2021-10-06 – 2021-10-21 (×2): qty 2, 28d supply, fill #2

## 2021-05-11 NOTE — Progress Notes (Signed)
Established Patient Office Visit  Subjective:  Patient ID: Samantha Simpson, female    DOB: [birth year]  Age: 51 y.o. MRN: [id number]  CC:  Chief Complaint  Patient presents with   Follow-up    Patient is in today for follow up HTN and glucose. She is doing great and feeling great.     HPI Samantha Simpson presents for follow-up for HTN, DM, HLD, and obesity.   Samantha Simpson reports she is is doing very well. She has been successful with monitoring her diet and increasing her activity. She reports that she has more energy and feels great. She has been monitoring her blood sugar levels with her FreeStyle and has altered her dietary habits to ensure that her blood sugars remain in control. She is walking several times a day and takes additional walks if she has an elevated blood sugar reading due to indulgences with food.  She tells me her blood pressures have lowered as well.  She is having routine eye exams and denies symptoms of chest pain, ShOB, palpitations, increased hunger, thirst, or urination.    ROS Review of Systems All review of systems negative except what is listed in the HPI   Objective:    Physical Exam Vitals and nursing note reviewed.  Constitutional:      Appearance: Normal appearance.  HENT:     Head: Normocephalic.  Eyes:     Extraocular Movements: Extraocular movements intact.     Conjunctiva/sclera: Conjunctivae normal.     Pupils: Pupils are equal, round, and reactive to light.  Neck:     Vascular: No carotid bruit.  Cardiovascular:     Rate and Rhythm: Normal rate and regular rhythm.     Pulses: Normal pulses.     Heart sounds: Normal heart sounds.  Pulmonary:     Effort: Pulmonary effort is normal.     Breath sounds: Normal breath sounds.  Musculoskeletal:     Cervical back: Normal range of motion.     Right lower leg: No edema.     Left lower leg: No edema.  Skin:    General: Skin is warm and dry.     Capillary Refill: Capillary refill  takes less than 2 seconds.  Neurological:     General: No focal deficit present.     Mental Status: She is alert and oriented to person, place, and time.  Psychiatric:        Mood and Affect: Mood normal.        Behavior: Behavior normal.        Thought Content: Thought content normal.        Judgment: Judgment normal.    BP 117/72    Pulse 75    Ht 5\' 5"  (1.651 m)    Wt 246 lb (111.6 kg)    SpO2 99%    BMI 40.94 kg/m  Wt Readings from Last 3 Encounters:  05/11/21 246 lb (111.6 kg)  02/08/21 275 lb 3.2 oz (124.8 kg)  01/13/21 278 lb (126.1 kg)        Assessment & Plan:   Problem List Items Addressed This Visit     Obesity - Primary    Doing fantastic with mounjaro.  Weight loss of 30 pounds so far.  Diet and activity levels fantastic. BG control fantastic.  Plan to continue current regimen. Will work to see if we can get insurance approval to continue mounjaro once coupon completes.  F/U in 6 months or sooner if needed  Relevant Medications   tirzepatide (MOUNJARO) 7.5 MG/0.5ML Pen   Continuous Blood Gluc Sensor (FREESTYLE LIBRE 3 SENSOR) MISC   Other Relevant Orders   CBC with Differential/Platelet (Completed)   Comprehensive metabolic panel (Completed)   Lipid panel (Completed)   Hemoglobin A1c (Completed)   VITAMIN D 25 Hydroxy (Vit-D Deficiency, Fractures) (Completed)   Type 2 diabetes mellitus with hyperglycemia, without long-term current use of insulin (HCC)    Doing fantastic with significant weight loss and glucose control. Libreview examined today with excellent findings.  She is having difficulty with insurance coverage for [facility]. She has failed metformin.  Given the significant results and control she has established with Mounjaro, I do not want to see her loose this benefit as it is clearly making a difference. We will work to see if we can get PA on this for her. We may be able to get a sample or coupon extension for her to use as she does lose this  benefit in the next 2-3 months.  Plan for now continue current treatment plan. If insurance will not cover without trial of second medication, we may have to trial alternative and see if this will work. She will check her insurance to see if they have another GLP-1 that is preferred.  F/U in 6 months or sooner if needed.       Relevant Medications   tirzepatide (MOUNJARO) 7.5 MG/0.5ML Pen   Continuous Blood Gluc Sensor (FREESTYLE LIBRE 3 SENSOR) MISC   Other Relevant Orders   CBC with Differential/Platelet (Completed)   Comprehensive metabolic panel (Completed)   Lipid panel (Completed)   Hemoglobin A1c (Completed)   VITAMIN D 25 Hydroxy (Vit-D Deficiency, Fractures) (Completed)   Hyperlipidemia associated with type 2 diabetes mellitus ([place])    Will check labs today, I anticipate good control based on other findings.  Recommend continue current plan and medications.  Will will make changes to the plan of care as needed based on findings if necessary.  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  No changes recommended at this time.  Continue to monitor BP at home and follow current diet and exercise plan.  No alarm symptoms present today.  Plan f/u in 6 months or sooner if needed.       Relevant Medications   tirzepatide (MOUNJARO) 7.5 MG/0.5ML Pen   Continuous Blood Gluc Sensor (FREESTYLE LIBRE 3 SENSOR) MISC   Other Relevant Orders   CBC with  Differential/Platelet (Completed)   Comprehensive metabolic panel (Completed)   Lipid panel (Completed)   Hemoglobin A1c (Completed)   VITAMIN D 25 Hydroxy (Vit-D Deficiency, Fractures) (Completed)   Anemia    Continuing with oral iron replacement.  Will plan to monitor labs today and make changes as necessary.  If stable, will plan to monitor in 1 year or sooner if needed.       Relevant Orders   CBC with Differential/Platelet (Completed)   Comprehensive metabolic panel (Completed)  Lipid panel (Completed)   Hemoglobin A1c (Completed)   VITAMIN D 25 Hydroxy (Vit-D Deficiency, Fractures) (Completed)   Hypertension   Relevant Medications   tirzepatide (MOUNJARO) 7.5 MG/0.5ML Pen   Continuous Blood Gluc Sensor (FREESTYLE LIBRE 3 SENSOR) MISC   Other Visit Diagnoses     Encounter for health-related screening       Relevant Orders   CBC with Differential/Platelet (Completed)   Comprehensive metabolic panel (Completed)   Lipid panel (Completed)   Hemoglobin A1c (Completed)   VITAMIN D 25 Hydroxy (Vit-D Deficiency, Fractures) (Completed)   Gastroesophageal reflux disease, unspecified whether esophagitis present       Relevant Medications   pantoprazole (PROTONIX) 40 MG tablet       Meds ordered this encounter  Medications   tirzepatide (MOUNJARO) 7.5 MG/0.5ML Pen    Sig: Inject 7.5 mg into the skin once a week.    Dispense:  6 mL    Refill:  1    Patient has coupon card: RxBIN: [id number] PCN: [id number] Group: [id number] ID#[id number] Expiration Date: 04/09/2022   pantoprazole (PROTONIX) 40 MG tablet    Sig: Take 1 tablet (40 mg total) by mouth 2 (two) times daily.    Dispense:  90 tablet    Refill:  3   Continuous Blood Gluc Sensor (FREESTYLE LIBRE 3 SENSOR) MISC    Sig: 1 Units by Does not apply route every 14 (fourteen) days. Place 1 sensor on the skin every 14 days. Use to check glucose continuously    Dispense:  2 each    Refill:  11    Follow-up: Return in about 6 months  (around 11/08/2021) for DM, HTN, HLD.    Orma Render, NP

## 2021-05-11 NOTE — Patient Instructions (Signed)
Continue doing AWESOME WORK!! I am SO PROUD OF YOU!!  Let me know about the mounjaro and I will do some research on my own to see what we can come up.   Let me know if you have any questions or concerns and I will plan to see you in 6 months unless you need me sooner.

## 2021-05-12 ENCOUNTER — Other Ambulatory Visit (HOSPITAL_BASED_OUTPATIENT_CLINIC_OR_DEPARTMENT_OTHER): Payer: Self-pay

## 2021-05-12 LAB — LIPID PANEL
Chol/HDL Ratio: 3 ratio (ref 0.0–4.4)
Cholesterol, Total: 124 mg/dL (ref 100–199)
HDL: 42 mg/dL
LDL Chol Calc (NIH): 63 mg/dL (ref 0–99)
Triglycerides: 105 mg/dL (ref 0–149)
VLDL Cholesterol Cal: 19 mg/dL (ref 5–40)

## 2021-05-12 LAB — CBC WITH DIFFERENTIAL/PLATELET
Basophils Absolute: 0.1 10*3/uL (ref 0.0–0.2)
Basos: 1 %
EOS (ABSOLUTE): 0.1 10*3/uL (ref 0.0–0.4)
Eos: 1 %
Hematocrit: 38.9 % (ref 34.0–46.6)
Hemoglobin: 13.3 g/dL (ref 11.1–15.9)
Immature Grans (Abs): 0 10*3/uL (ref 0.0–0.1)
Immature Granulocytes: 0 %
Lymphocytes Absolute: 2.2 10*3/uL (ref 0.7–3.1)
Lymphs: 20 %
MCH: 30.6 pg (ref 26.6–33.0)
MCHC: 34.2 g/dL (ref 31.5–35.7)
MCV: 90 fL (ref 79–97)
Monocytes Absolute: 0.6 10*3/uL (ref 0.1–0.9)
Monocytes: 6 %
Neutrophils Absolute: 7.9 10*3/uL — ABNORMAL HIGH (ref 1.4–7.0)
Neutrophils: 72 %
Platelets: 403 10*3/uL (ref 150–450)
RBC: 4.34 x10E6/uL (ref 3.77–5.28)
RDW: 13.8 % (ref 11.7–15.4)
WBC: 10.8 10*3/uL (ref 3.4–10.8)

## 2021-05-12 LAB — COMPREHENSIVE METABOLIC PANEL
ALT: 12 IU/L (ref 0–32)
AST: 18 IU/L (ref 0–40)
Albumin/Globulin Ratio: 1.2 (ref 1.2–2.2)
Albumin: 4.3 g/dL (ref 3.8–4.9)
Alkaline Phosphatase: 75 IU/L (ref 44–121)
BUN/Creatinine Ratio: 12 (ref 9–23)
BUN: 11 mg/dL (ref 6–24)
Bilirubin Total: 0.5 mg/dL (ref 0.0–1.2)
CO2: 25 mmol/L (ref 20–29)
Calcium: 10.1 mg/dL (ref 8.7–10.2)
Chloride: 97 mmol/L (ref 96–106)
Creatinine, Ser: 0.93 mg/dL (ref 0.57–1.00)
Globulin, Total: 3.5 g/dL (ref 1.5–4.5)
Glucose: 112 mg/dL — ABNORMAL HIGH (ref 70–99)
Potassium: 3.8 mmol/L (ref 3.5–5.2)
Sodium: 138 mmol/L (ref 134–144)
Total Protein: 7.8 g/dL (ref 6.0–8.5)
eGFR: 74 mL/min/{1.73_m2} (ref >59)

## 2021-05-12 LAB — VITAMIN D 25 HYDROXY (VIT D DEFICIENCY, FRACTURES): Vit D, 25-Hydroxy: 30.4 ng/mL (ref 30.0–100.0)

## 2021-05-12 LAB — HEMOGLOBIN A1C
Est. average glucose Bld gHb Est-mCnc: 114 mg/dL
Hgb A1c MFr Bld: 5.6 % (ref 4.8–5.6)

## 2021-05-13 NOTE — Assessment & Plan Note (Signed)
Doing fantastic with mounjaro.  Weight loss of 30 pounds so far.  Diet and activity levels fantastic. BG control fantastic.  Plan to continue current regimen. Will work to see if we can get insurance approval to continue mounjaro once coupon completes.  F/U in 6 months or sooner if needed

## 2021-05-13 NOTE — Assessment & Plan Note (Signed)
Will check labs today, I anticipate good control based on other findings.  Recommend continue current plan and medications.  Will will make changes to the plan of care as needed based on findings if necessary.  F/U in 6 months or sooner if needed

## 2021-05-13 NOTE — Assessment & Plan Note (Signed)
Doing fantastic with significant weight loss and glucose control. Libreview examined today with excellent findings.  She is having difficulty with insurance coverage for [facility]. She has failed metformin.  Given the significant results and control she has established with Mounjaro, I do not want to see her loose this benefit as it is clearly making a difference. We will work to see if we can get PA on this for her. We may be able to get a sample or coupon extension for her to use as she does lose this benefit in the next 2-3 months.  Plan for now continue current treatment plan. If insurance will not cover without trial of second medication, we may have to trial alternative and see if this will work. She will check her insurance to see if they have another GLP-1 that is preferred.  F/U in 6 months or sooner if needed.

## 2021-05-13 NOTE — Assessment & Plan Note (Signed)
Blood pressures very well controlled with diet, exercise, and excellent glucose control.  No changes recommended at this time.  Continue to monitor BP at home and follow current diet and exercise plan.  No alarm symptoms present today.  Plan f/u in 6 months or sooner if needed.

## 2021-05-13 NOTE — Assessment & Plan Note (Signed)
Continuing with oral iron replacement.  Will plan to monitor labs today and make changes as necessary.  If stable, will plan to monitor in 1 year or sooner if needed.

## 2021-05-20 ENCOUNTER — Other Ambulatory Visit (HOSPITAL_BASED_OUTPATIENT_CLINIC_OR_DEPARTMENT_OTHER): Payer: Self-pay | Admitting: Nurse Practitioner

## 2021-05-20 DIAGNOSIS — E1169 Type 2 diabetes mellitus with other specified complication: Secondary | ICD-10-CM

## 2021-05-20 DIAGNOSIS — E1165 Type 2 diabetes mellitus with hyperglycemia: Secondary | ICD-10-CM

## 2021-05-20 DIAGNOSIS — E349 Endocrine disorder, unspecified: Secondary | ICD-10-CM

## 2021-05-20 DIAGNOSIS — E785 Hyperlipidemia, unspecified: Secondary | ICD-10-CM

## 2021-06-03 ENCOUNTER — Other Ambulatory Visit (HOSPITAL_BASED_OUTPATIENT_CLINIC_OR_DEPARTMENT_OTHER): Payer: Self-pay

## 2021-07-14 ENCOUNTER — Other Ambulatory Visit (HOSPITAL_BASED_OUTPATIENT_CLINIC_OR_DEPARTMENT_OTHER): Payer: Self-pay

## 2021-07-15 ENCOUNTER — Other Ambulatory Visit (HOSPITAL_BASED_OUTPATIENT_CLINIC_OR_DEPARTMENT_OTHER): Payer: Self-pay

## 2021-07-15 ENCOUNTER — Other Ambulatory Visit (HOSPITAL_BASED_OUTPATIENT_CLINIC_OR_DEPARTMENT_OTHER): Payer: Self-pay | Admitting: Nurse Practitioner

## 2021-07-15 DIAGNOSIS — E1165 Type 2 diabetes mellitus with hyperglycemia: Secondary | ICD-10-CM

## 2021-07-15 DIAGNOSIS — E1169 Type 2 diabetes mellitus with other specified complication: Secondary | ICD-10-CM

## 2021-07-15 DIAGNOSIS — E349 Endocrine disorder, unspecified: Secondary | ICD-10-CM

## 2021-07-22 ENCOUNTER — Other Ambulatory Visit (HOSPITAL_BASED_OUTPATIENT_CLINIC_OR_DEPARTMENT_OTHER): Payer: Self-pay

## 2021-08-10 ENCOUNTER — Other Ambulatory Visit (HOSPITAL_BASED_OUTPATIENT_CLINIC_OR_DEPARTMENT_OTHER): Payer: Self-pay | Admitting: Nurse Practitioner

## 2021-08-10 DIAGNOSIS — E1169 Type 2 diabetes mellitus with other specified complication: Secondary | ICD-10-CM

## 2021-08-10 DIAGNOSIS — E349 Endocrine disorder, unspecified: Secondary | ICD-10-CM

## 2021-08-10 DIAGNOSIS — E1165 Type 2 diabetes mellitus with hyperglycemia: Secondary | ICD-10-CM

## 2021-09-07 ENCOUNTER — Other Ambulatory Visit (HOSPITAL_BASED_OUTPATIENT_CLINIC_OR_DEPARTMENT_OTHER): Payer: Self-pay | Admitting: Nurse Practitioner

## 2021-09-07 DIAGNOSIS — E1169 Type 2 diabetes mellitus with other specified complication: Secondary | ICD-10-CM

## 2021-09-07 DIAGNOSIS — E349 Endocrine disorder, unspecified: Secondary | ICD-10-CM

## 2021-09-07 DIAGNOSIS — E1165 Type 2 diabetes mellitus with hyperglycemia: Secondary | ICD-10-CM

## 2021-09-12 ENCOUNTER — Other Ambulatory Visit (HOSPITAL_BASED_OUTPATIENT_CLINIC_OR_DEPARTMENT_OTHER): Payer: Self-pay

## 2021-09-30 ENCOUNTER — Other Ambulatory Visit (HOSPITAL_BASED_OUTPATIENT_CLINIC_OR_DEPARTMENT_OTHER): Payer: Self-pay | Admitting: Nurse Practitioner

## 2021-09-30 DIAGNOSIS — E1165 Type 2 diabetes mellitus with hyperglycemia: Secondary | ICD-10-CM

## 2021-09-30 DIAGNOSIS — E349 Endocrine disorder, unspecified: Secondary | ICD-10-CM

## 2021-09-30 DIAGNOSIS — E1169 Type 2 diabetes mellitus with other specified complication: Secondary | ICD-10-CM

## 2021-10-06 ENCOUNTER — Other Ambulatory Visit (HOSPITAL_BASED_OUTPATIENT_CLINIC_OR_DEPARTMENT_OTHER): Payer: Self-pay

## 2021-10-07 ENCOUNTER — Other Ambulatory Visit (HOSPITAL_BASED_OUTPATIENT_CLINIC_OR_DEPARTMENT_OTHER): Payer: Self-pay | Admitting: Nurse Practitioner

## 2021-10-07 ENCOUNTER — Other Ambulatory Visit (HOSPITAL_BASED_OUTPATIENT_CLINIC_OR_DEPARTMENT_OTHER): Payer: Self-pay

## 2021-10-07 ENCOUNTER — Encounter (HOSPITAL_BASED_OUTPATIENT_CLINIC_OR_DEPARTMENT_OTHER): Payer: Self-pay | Admitting: Nurse Practitioner

## 2021-10-07 DIAGNOSIS — E1165 Type 2 diabetes mellitus with hyperglycemia: Secondary | ICD-10-CM

## 2021-10-07 DIAGNOSIS — E349 Endocrine disorder, unspecified: Secondary | ICD-10-CM

## 2021-10-07 DIAGNOSIS — E1169 Type 2 diabetes mellitus with other specified complication: Secondary | ICD-10-CM

## 2021-10-07 MED ORDER — ATORVASTATIN CALCIUM 10 MG PO TABS: 10.0000 mg | ORAL_TABLET | Freq: Every day | ORAL | 0 refills | Status: DC

## 2021-10-07 MED ORDER — ATORVASTATIN CALCIUM 10 MG PO TABS
10.0000 mg | ORAL_TABLET | Freq: Every day | ORAL | 0 refills | Status: DC
  Filled 2021-10-07 – 2021-10-21 (×2): qty 30, 30d supply, fill #0

## 2021-10-17 ENCOUNTER — Other Ambulatory Visit (HOSPITAL_BASED_OUTPATIENT_CLINIC_OR_DEPARTMENT_OTHER): Payer: Self-pay

## 2021-10-18 ENCOUNTER — Other Ambulatory Visit (HOSPITAL_BASED_OUTPATIENT_CLINIC_OR_DEPARTMENT_OTHER): Payer: Self-pay

## 2021-10-21 ENCOUNTER — Other Ambulatory Visit (HOSPITAL_BASED_OUTPATIENT_CLINIC_OR_DEPARTMENT_OTHER): Payer: Self-pay

## 2021-10-24 ENCOUNTER — Other Ambulatory Visit (HOSPITAL_BASED_OUTPATIENT_CLINIC_OR_DEPARTMENT_OTHER): Payer: Self-pay

## 2021-10-28 ENCOUNTER — Other Ambulatory Visit (HOSPITAL_BASED_OUTPATIENT_CLINIC_OR_DEPARTMENT_OTHER): Payer: Self-pay

## 2021-11-08 ENCOUNTER — Ambulatory Visit (HOSPITAL_BASED_OUTPATIENT_CLINIC_OR_DEPARTMENT_OTHER): Payer: BC Managed Care – PPO | Admitting: Nurse Practitioner

## 2021-11-14 ENCOUNTER — Ambulatory Visit (HOSPITAL_BASED_OUTPATIENT_CLINIC_OR_DEPARTMENT_OTHER): Payer: BC Managed Care – PPO | Admitting: Nurse Practitioner

## 2021-11-14 ENCOUNTER — Other Ambulatory Visit (HOSPITAL_BASED_OUTPATIENT_CLINIC_OR_DEPARTMENT_OTHER): Payer: Self-pay

## 2021-11-14 ENCOUNTER — Encounter (HOSPITAL_BASED_OUTPATIENT_CLINIC_OR_DEPARTMENT_OTHER): Payer: Self-pay | Admitting: Nurse Practitioner

## 2021-11-14 VITALS — BP 122/78 | HR 75 | Temp 98.7°F | Ht 65.0 in | Wt 233.0 lb

## 2021-11-14 DIAGNOSIS — Z6841 Body Mass Index (BMI) 40.0 and over, adult: Secondary | ICD-10-CM | POA: Diagnosis not present

## 2021-11-14 DIAGNOSIS — B070 Plantar wart: Secondary | ICD-10-CM

## 2021-11-14 DIAGNOSIS — E1165 Type 2 diabetes mellitus with hyperglycemia: Secondary | ICD-10-CM | POA: Diagnosis not present

## 2021-11-14 DIAGNOSIS — E1169 Type 2 diabetes mellitus with other specified complication: Secondary | ICD-10-CM | POA: Diagnosis not present

## 2021-11-14 DIAGNOSIS — I10 Essential (primary) hypertension: Secondary | ICD-10-CM

## 2021-11-14 DIAGNOSIS — E785 Hyperlipidemia, unspecified: Secondary | ICD-10-CM

## 2021-11-14 DIAGNOSIS — I152 Hypertension secondary to endocrine disorders: Secondary | ICD-10-CM

## 2021-11-14 DIAGNOSIS — E349 Endocrine disorder, unspecified: Secondary | ICD-10-CM

## 2021-11-14 DIAGNOSIS — K219 Gastro-esophageal reflux disease without esophagitis: Secondary | ICD-10-CM

## 2021-11-14 DIAGNOSIS — R5382 Chronic fatigue, unspecified: Secondary | ICD-10-CM

## 2021-11-14 MED ORDER — VALSARTAN-HYDROCHLOROTHIAZIDE 160-25 MG PO TABS
1.0000 | ORAL_TABLET | Freq: Every day | ORAL | 3 refills | Status: DC
  Filled 2021-11-14: qty 90, 90d supply, fill #0
  Filled 2022-02-05: qty 90, 90d supply, fill #1
  Filled 2022-05-13 (×2): qty 90, 90d supply, fill #2
  Filled 2022-10-14: qty 90, 90d supply, fill #3

## 2021-11-14 MED ORDER — PANTOPRAZOLE SODIUM 40 MG PO TBEC
40.0000 mg | DELAYED_RELEASE_TABLET | Freq: Two times a day (BID) | ORAL | 3 refills | Status: DC
  Filled 2021-11-14: qty 90, 45d supply, fill #0
  Filled 2022-02-05: qty 90, 45d supply, fill #1
  Filled 2022-03-22: qty 90, 45d supply, fill #2
  Filled 2022-05-13 (×2): qty 90, 45d supply, fill #3

## 2021-11-14 MED ORDER — ATORVASTATIN CALCIUM 10 MG PO TABS
10.0000 mg | ORAL_TABLET | Freq: Every day | ORAL | 3 refills | Status: DC
  Filled 2021-11-14: qty 90, 90d supply, fill #0
  Filled 2022-02-05: qty 90, 90d supply, fill #1
  Filled 2022-05-13 (×2): qty 90, 90d supply, fill #2
  Filled 2022-10-14: qty 90, 90d supply, fill #3

## 2021-11-14 MED ORDER — AMLODIPINE BESYLATE 10 MG PO TABS
10.0000 mg | ORAL_TABLET | Freq: Every day | ORAL | 3 refills | Status: DC
  Filled 2021-11-14: qty 90, 90d supply, fill #0
  Filled 2022-02-05: qty 90, 90d supply, fill #1
  Filled 2022-05-13 (×2): qty 90, 90d supply, fill #2

## 2021-11-14 MED ORDER — TIRZEPATIDE 10 MG/0.5ML ~~LOC~~ SOAJ
10.0000 mg | SUBCUTANEOUS | 3 refills | Status: DC
  Filled 2021-11-14: qty 2, 28d supply, fill #0
  Filled 2021-12-14: qty 2, 28d supply, fill #1
  Filled 2022-01-11 – 2022-05-13 (×2): qty 2, 28d supply, fill #2

## 2021-11-14 NOTE — Progress Notes (Signed)
Shawna Clamp, DNP, AGNP-c [facility] [address] [place], [place] [address] [phone] Office [phone] Fax  ESTABLISHED PATIENT- Chronic Health and/or Follow-Up Visit  Blood pressure 122/78, pulse 75, temperature 98.7 F (37.1 C), height 5\' 5"  (1.651 m), weight 233 lb (105.7 kg), SpO2 98 %.  Follow-up (Patient presents for follow up for DM, HTN and HLD. Bilateral foot pain patient thinks it is corns. They are painful. She would like to up her dose on Monjauro.)   HPI  Samantha Simpson  is a 51 y.o. 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 [employer] and she has been walking up and down stairs without issue which is a significant change from the past Her BG values are consistently less than 100 She is monitoring her food very closely and has a routine of healthy eating habits Vision exam this week scheduled- no vision changes noted She denies CP, palpitations, ShOB, N/V HTN No dizziness, ShOB, CP, palpitations, weakness, headaches HLD Monitoring diet closely Insurance is not paying for the freestyle- she is paying out of pocket for this monthly Feet She endorses soreness to the lateral plantar surface of her feet bilaterally with hard spots  She has been monitoring closely, soaking her feet and using a pumice stone  She tells me that the areas will go down a bit and seem to go away, but within a week come back.   ROS All ROS negative with exception of what is listed in HPI  PHYSICAL EXAM Physical Exam Vitals and nursing note reviewed.  Constitutional:      General: She is not in acute distress.    Appearance: Normal appearance.  HENT:     Head:  Normocephalic.  Eyes:     Extraocular Movements: Extraocular movements intact.     Conjunctiva/sclera: Conjunctivae normal.     Pupils: Pupils are equal, round, and reactive to light.  Neck:     Vascular: No carotid bruit.  Cardiovascular:     Rate and Rhythm: Normal rate and regular rhythm.     Pulses: Normal pulses.     Heart sounds: Normal heart sounds. No murmur heard. Pulmonary:     Effort: Pulmonary effort is normal.     Breath sounds: Normal breath sounds. No wheezing.  Abdominal:     General: Bowel sounds are normal. There is no distension.     Palpations: Abdomen is soft.     Tenderness: There is no abdominal tenderness. There is no guarding.  Musculoskeletal:        General: Normal range of motion.     Cervical back: Normal range of motion and neck supple.     Right lower leg: No edema.     Left lower leg: No edema.  Lymphadenopathy:     Cervical: No cervical adenopathy.  Skin:    General: Skin is warm and dry.     Capillary Refill: Capillary refill takes less than 2 seconds.     Comments: Bilateral plantar warts present on the lateral plantar surface of the feet  Neurological:     General: No focal deficit present.     Mental Status: She is alert and oriented to  person, place, and time.  Psychiatric:        Mood and Affect: Mood normal.        Behavior: Behavior normal.        Thought Content: Thought content normal.        Judgment: Judgment normal.     ASSESSMENT & PLAN Problem List Items Addressed This Visit     Obesity - Primary    She is doing amazingly well with her weight loss efforts with lifestyle changes including diet and exercise and with additional help from [facility].  Current BMI down to 38.77 from initial BMI of 48.79.  I am very proud of her and the significant changes she has made and the impact this has had on her health and wellbeing.  Recommend continuation of current measures.      Relevant Medications   tirzepatide (MOUNJARO) 10  MG/0.5ML Pen   Other Relevant Orders   Comprehensive metabolic panel   Hemoglobin A1c   Lipid panel   CBC With Diff/Platelet   Iron, TIBC and Ferritin Panel   RESOLVED: Hypertension   Relevant Medications   tirzepatide (MOUNJARO) 10 MG/0.5ML Pen   valsartan-hydrochlorothiazide (DIOVAN-HCT) 160-25 MG tablet   amLODipine (NORVASC) 10 MG tablet   atorvastatin (LIPITOR) 10 MG tablet   Other Relevant Orders   Comprehensive metabolic panel   Hemoglobin A1c   Lipid panel   CBC With Diff/Platelet   Iron, TIBC and Ferritin Panel   Type 2 diabetes mellitus with hyperglycemia, without long-term current use of insulin (HCC)    Chronic.  Significant improvement in daily blood sugar readings with use of Mounjaro.  She is using a freestyle libre for continuous glucose monitoring to ensure that she does not have any hypoglycemic episodes-unfortunately insurance is not paying for this at this time.  We will see if we can get a prior authorization for an exception of this for her. She is doing fantastic with her diet and exercise and making significant lifestyle changes.  She is currently on statin therapy.  Her eye exam is scheduled for this week. She has noted some increase in her appetite recently on the 7.5 mg of Mounjaro.  She has been on this dose for several months now.  We will increase the dose to 10 mg today and monitor closely.  We will obtain labs today for evaluation and monitoring and make changes to plan of care as necessary based on findings.      Relevant Medications   tirzepatide (MOUNJARO) 10 MG/0.5ML Pen   valsartan-hydrochlorothiazide (DIOVAN-HCT) 160-25 MG tablet   atorvastatin (LIPITOR) 10 MG tablet   Other Relevant Orders   Comprehensive metabolic panel   Hemoglobin A1c   Lipid panel   CBC With Diff/Platelet   Iron, TIBC and Ferritin Panel   Hyperlipidemia associated with type 2 diabetes mellitus (HCC)    Chronic.  Currently on statin therapy and tolerating well.   Significant improvement to diet and exercise as well as weight loss which I suspect will have a positive impact on her cholesterol levels.  Will monitor labs today.  No changes to medication at this time. Follow-up in 6 months or sooner if needed.      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  Blood pressure very well-controlled at this time with valsartan-hydrochlorothiazide.  Doing an excellent job with diet and exercise.  Significant weight loss has improved control as well.  At this time no changes to plan of care. Continue to monitor blood pressures at home and follow current diet and exercise plan.  We will monitor labs today and make changes to plan of care as necessary based on findings. Follow-up in 6 months or sooner if needed.      Relevant Medications   tirzepatide (MOUNJARO) 10 MG/0.5ML Pen   valsartan-hydrochlorothiazide (DIOVAN-HCT) 160-25 MG tablet   amLODipine (NORVASC) 10 MG tablet   atorvastatin (LIPITOR) 10 MG tablet   Other Relevant Orders   Comprehensive metabolic panel   Hemoglobin A1c   Lipid panel   CBC With Diff/Platelet   Iron, TIBC and Ferritin Panel   Gastroesophageal reflux disease    Chronic.  Stable on current medication regimen.  Monitoring diet and avoiding trigger foods. Continue current plan of care.  Refills on pantoprazole provided.  No alarm symptoms present at this time.      Relevant Medications   pantoprazole (PROTONIX) 40 MG tablet   Other Visit Diagnoses     Plantar wart of both feet       Chronic fatigue       Relevant Orders    Comprehensive metabolic panel   Hemoglobin A1c   Lipid panel   CBC With Diff/Platelet   Iron, TIBC and Ferritin Panel   Thyroid Panel With TSH        FOLLOW-UP Return in about 6 months (around 05/17/2022) for DM, HTN, HLD.   Shawna Clamp, DNP, AGNP-c 11/14/2021  8:25 AM

## 2021-11-14 NOTE — Assessment & Plan Note (Signed)
Chronic.  Currently on statin therapy and tolerating well.  Significant improvement to diet and exercise as well as weight loss which I suspect will have a positive impact on her cholesterol levels.  Will monitor labs today.  No changes to medication at this time. Follow-up in 6 months or sooner if needed.

## 2021-11-14 NOTE — Assessment & Plan Note (Signed)
Chronic.  Significant improvement in daily blood sugar readings with use of Mounjaro.  She is using a freestyle libre for continuous glucose monitoring to ensure that she does not have any hypoglycemic episodes-unfortunately insurance is not paying for this at this time.  We will see if we can get a prior authorization for an exception of this for her. She is doing fantastic with her diet and exercise and making significant lifestyle changes.  She is currently on statin therapy.  Her eye exam is scheduled for this week. She has noted some increase in her appetite recently on the 7.5 mg of Mounjaro.  She has been on this dose for several months now.  We will increase the dose to 10 mg today and monitor closely.  We will obtain labs today for evaluation and monitoring and make changes to plan of care as necessary based on findings.

## 2021-11-14 NOTE — Assessment & Plan Note (Signed)
Chronic.  Stable on current medication regimen.  Monitoring diet and avoiding trigger foods. Continue current plan of care.  Refills on pantoprazole provided.  No alarm symptoms present at this time.

## 2021-11-14 NOTE — Patient Instructions (Addendum)
Ask the pharmacist if they have any liquid salicylic acid treatment for plantar warts. Apply this once a day to the areas on the bottom of your feet and continue with the soaking treatment that you are doing. You should notice that the areas start to shrink in size, but this can take a few weeks to full resolve. If this does not help, let me know.   Keep up the amazing work!! I am so proud of you!!

## 2021-11-14 NOTE — Assessment & Plan Note (Signed)
She is doing amazingly well with her weight loss efforts with lifestyle changes including diet and exercise and with additional help from [facility].  Current BMI down to 38.77 from initial BMI of 48.79.  I am very proud of her and the significant changes she has made and the impact this has had on her health and wellbeing.  Recommend continuation of current measures.

## 2021-11-14 NOTE — Assessment & Plan Note (Signed)
Chronic.  Blood pressure very well-controlled at this time with valsartan-hydrochlorothiazide.  Doing an excellent job with diet and exercise.  Significant weight loss has improved control as well.  At this time no changes to plan of care. Continue to monitor blood pressures at home and follow current diet and exercise plan.  We will monitor labs today and make changes to plan of care as necessary based on findings. Follow-up in 6 months or sooner if needed.

## 2021-11-15 LAB — CBC WITH DIFF/PLATELET
Basophils Absolute: 0 10*3/uL (ref 0.0–0.2)
Basos: 0 %
EOS (ABSOLUTE): 0.1 10*3/uL (ref 0.0–0.4)
Eos: 1 %
Hematocrit: 39.7 % (ref 34.0–46.6)
Hemoglobin: 13.3 g/dL (ref 11.1–15.9)
Immature Grans (Abs): 0 10*3/uL (ref 0.0–0.1)
Immature Granulocytes: 0 %
Lymphocytes Absolute: 2.3 10*3/uL (ref 0.7–3.1)
Lymphs: 24 %
MCH: 31.4 pg (ref 26.6–33.0)
MCHC: 33.5 g/dL (ref 31.5–35.7)
MCV: 94 fL (ref 79–97)
Monocytes Absolute: 0.6 10*3/uL (ref 0.1–0.9)
Monocytes: 7 %
Neutrophils Absolute: 6.3 10*3/uL (ref 1.4–7.0)
Neutrophils: 68 %
Platelets: 359 10*3/uL (ref 150–450)
RBC: 4.24 x10E6/uL (ref 3.77–5.28)
RDW: 12.9 % (ref 11.7–15.4)
WBC: 9.3 10*3/uL (ref 3.4–10.8)

## 2021-11-15 LAB — LIPID PANEL
Chol/HDL Ratio: 2.7 ratio (ref 0.0–4.4)
Cholesterol, Total: 123 mg/dL (ref 100–199)
HDL: 45 mg/dL (ref >39)
LDL Chol Calc (NIH): 60 mg/dL (ref 0–99)
Triglycerides: 94 mg/dL (ref 0–149)
VLDL Cholesterol Cal: 18 mg/dL (ref 5–40)

## 2021-11-15 LAB — COMPREHENSIVE METABOLIC PANEL
ALT: 11 IU/L (ref 0–32)
AST: 15 IU/L (ref 0–40)
Albumin/Globulin Ratio: 1.5 (ref 1.2–2.2)
Albumin: 4.4 g/dL (ref 3.8–4.9)
Alkaline Phosphatase: 68 IU/L (ref 44–121)
BUN/Creatinine Ratio: 17 (ref 9–23)
BUN: 13 mg/dL (ref 6–24)
Bilirubin Total: 0.4 mg/dL (ref 0.0–1.2)
CO2: 24 mmol/L (ref 20–29)
Calcium: 10 mg/dL (ref 8.7–10.2)
Chloride: 101 mmol/L (ref 96–106)
Creatinine, Ser: 0.77 mg/dL (ref 0.57–1.00)
Globulin, Total: 2.9 g/dL (ref 1.5–4.5)
Glucose: 91 mg/dL (ref 70–99)
Potassium: 4 mmol/L (ref 3.5–5.2)
Sodium: 141 mmol/L (ref 134–144)
Total Protein: 7.3 g/dL (ref 6.0–8.5)
eGFR: 93 mL/min/{1.73_m2} (ref >59)

## 2021-11-15 LAB — HEMOGLOBIN A1C
Est. average glucose Bld gHb Est-mCnc: 94 mg/dL
Hgb A1c MFr Bld: 4.9 % (ref 4.8–5.6)

## 2021-11-15 LAB — THYROID PANEL WITH TSH
Free Thyroxine Index: 2.3 (ref 1.2–4.9)
T3 Uptake Ratio: 26 % (ref 24–39)
T4, Total: 8.7 ug/dL (ref 4.5–12.0)
TSH: 2.58 u[IU]/mL (ref 0.450–4.500)

## 2021-11-15 LAB — IRON,TIBC AND FERRITIN PANEL
Ferritin: 55 ng/mL (ref 15–150)
Iron Saturation: 18 % (ref 15–55)
Iron: 52 ug/dL (ref 27–159)
Total Iron Binding Capacity: 292 ug/dL (ref 250–450)
UIBC: 240 ug/dL (ref 131–425)

## 2021-12-14 ENCOUNTER — Other Ambulatory Visit (HOSPITAL_BASED_OUTPATIENT_CLINIC_OR_DEPARTMENT_OTHER): Payer: Self-pay

## 2021-12-15 ENCOUNTER — Other Ambulatory Visit (HOSPITAL_BASED_OUTPATIENT_CLINIC_OR_DEPARTMENT_OTHER): Payer: Self-pay

## 2022-01-05 ENCOUNTER — Other Ambulatory Visit: Payer: Self-pay

## 2022-01-05 ENCOUNTER — Encounter (HOSPITAL_BASED_OUTPATIENT_CLINIC_OR_DEPARTMENT_OTHER): Payer: Self-pay | Admitting: Nurse Practitioner

## 2022-01-05 ENCOUNTER — Encounter (HOSPITAL_COMMUNITY): Payer: Self-pay | Admitting: Emergency Medicine

## 2022-01-05 ENCOUNTER — Emergency Department (HOSPITAL_COMMUNITY)
Admission: EM | Admit: 2022-01-05 | Discharge: 2022-01-05 | Disposition: A | Discharge status: Home / Self Care | Payer: BC Managed Care – PPO | Attending: Emergency Medicine | Admitting: Emergency Medicine

## 2022-01-05 ENCOUNTER — Ambulatory Visit (INDEPENDENT_AMBULATORY_CARE_PROVIDER_SITE_OTHER): Payer: BC Managed Care – PPO | Admitting: Nurse Practitioner

## 2022-01-05 DIAGNOSIS — R531 Weakness: Secondary | ICD-10-CM | POA: Diagnosis not present

## 2022-01-05 DIAGNOSIS — I10 Essential (primary) hypertension: Secondary | ICD-10-CM | POA: Diagnosis not present

## 2022-01-05 DIAGNOSIS — Z79899 Other long term (current) drug therapy: Secondary | ICD-10-CM | POA: Insufficient documentation

## 2022-01-05 DIAGNOSIS — N939 Abnormal uterine and vaginal bleeding, unspecified: Secondary | ICD-10-CM | POA: Diagnosis not present

## 2022-01-05 DIAGNOSIS — R5383 Other fatigue: Secondary | ICD-10-CM

## 2022-01-05 DIAGNOSIS — R42 Dizziness and giddiness: Secondary | ICD-10-CM | POA: Insufficient documentation

## 2022-01-05 DIAGNOSIS — N9489 Other specified conditions associated with female genital organs and menstrual cycle: Secondary | ICD-10-CM | POA: Insufficient documentation

## 2022-01-05 LAB — CBC
HCT: 40 % (ref 36.0–46.0)
Hemoglobin: 13.9 g/dL (ref 12.0–15.0)
MCH: 31.9 pg (ref 26.0–34.0)
MCHC: 34.8 g/dL (ref 30.0–36.0)
MCV: 91.7 fL (ref 80.0–100.0)
Platelets: 369 10*3/uL (ref 150–400)
RBC: 4.36 MIL/uL (ref 3.87–5.11)
RDW: 13.2 % (ref 11.5–15.5)
WBC: 8.1 10*3/uL (ref 4.0–10.5)
nRBC: 0 % (ref 0.0–0.2)

## 2022-01-05 LAB — I-STAT BETA HCG BLOOD, ED (MC, WL, AP ONLY): I-stat hCG, quantitative: <5 m[IU]/mL (ref <5)

## 2022-01-05 LAB — COMPREHENSIVE METABOLIC PANEL
ALT: 13 U/L (ref 0–44)
AST: 16 U/L (ref 15–41)
Albumin: 4 g/dL (ref 3.5–5.0)
Alkaline Phosphatase: 54 U/L (ref 38–126)
Anion gap: 5 (ref 5–15)
BUN: 12 mg/dL (ref 6–20)
CO2: 28 mmol/L (ref 22–32)
Calcium: 9.3 mg/dL (ref 8.9–10.3)
Chloride: 103 mmol/L (ref 98–111)
Creatinine, Ser: 0.7 mg/dL (ref 0.44–1.00)
GFR, Estimated: >60 mL/min (ref >60)
Glucose, Bld: 88 mg/dL (ref 70–99)
Potassium: 3.1 mmol/L — ABNORMAL LOW (ref 3.5–5.1)
Sodium: 136 mmol/L (ref 135–145)
Total Bilirubin: 0.6 mg/dL (ref 0.3–1.2)
Total Protein: 8.1 g/dL (ref 6.5–8.1)

## 2022-01-05 LAB — TYPE AND SCREEN
ABO/RH(D): AB POS
Antibody Screen: NEGATIVE

## 2022-01-05 MED ORDER — POTASSIUM CHLORIDE CRYS ER 20 MEQ PO TBCR
40.0000 meq | EXTENDED_RELEASE_TABLET | Freq: Once | ORAL | Status: AC
  Administered 2022-01-05: 40 meq via ORAL
  Filled 2022-01-05: qty 2

## 2022-01-05 NOTE — Discharge Instructions (Signed)
As we discussed, you may be bleeding because you are close to menopause.  Your hemoglobin is normal right now.  Please continue taking iron supplements  You need to follow-up with GYN doctor to get Pap smear.  If you are still bleeding at that time, they may do an ultrasound as well  Return to ER if you have worse vaginal bleeding, passing out, chest pain, shortness of breath

## 2022-01-05 NOTE — ED Triage Notes (Signed)
Pt reports being sent by PCP due to possible low hgb. Pt reports heavy menstrual cycle & now is weak & light headed.

## 2022-01-05 NOTE — ED Notes (Signed)
Vital signs stable. 

## 2022-01-05 NOTE — ED Provider Notes (Signed)
[facility] DEPT Provider Note   CSN: [id number] Arrival date & time: 01/05/22  1154     History  Chief Complaint  Patient presents with   Weakness    Samantha Simpson is a 51 y.o. female hx of HTN, here presenting with vaginal bleeding and weakness and dizziness.  Patient states that she has been having vaginal bleeding for the last several days.  She states that she is around perimenopause.  States that she changed 7 pads yesterday.  She states that she does not have any regular periods anymore.  She states that her last Pap smear was several years ago.  She went to PCP today and complained of dizziness and was sent here to get a CBC and further evaluation.  She states that she is not bleeding right now.  Denies any abdominal pain.  Denies any concern for STDs  The history is provided by the patient.       Home Medications Prior to Admission medications   Medication Sig Start Date End Date Taking? Authorizing Provider  ACCU-CHEK GUIDE test strip 1 each by Other route QID. 11/08/20   [provider]  Accu-Chek Softclix Lancets lancets 1 each by Other route QID. 12/02/20   [provider]  AMBULATORY NON FORMULARY MEDICATION Medication Name: [place] 3 sensor. No reader required. Dx Code: E11.65 02/08/21   Orma Render, NP  amLODipine (NORVASC) 10 MG tablet Take 1 tablet (10 mg total) by mouth daily. 11/14/21   Orma Render, NP  atorvastatin (LIPITOR) 10 MG tablet Take 1 tablet (10 mg total) by mouth at bedtime. 11/14/21   Orma Render, NP  blood glucose meter kit and supplies KIT Dispense based on patient and insurance preference. Use up to four times daily as directed. Please include lancets, test strips, control solution. Dx: E11.65 11/08/20   Orma Render, NP  Continuous Blood Gluc Sensor (FREESTYLE LIBRE 3 SENSOR) MISC 1 Units by Does not apply route every 14 (fourteen) days. Place 1 sensor on the skin every 14 days. Use to  check glucose continuously 05/11/21   Early, Coralee Pesa, NP  ferrous sulfate 325 (65 FE) MG EC tablet Take 1 tablet (325 mg total) by mouth daily with breakfast. Take with food to avoid stomach upset. Patient taking differently: Take 325 mg by mouth 2 (two) times daily. Take with food to avoid stomach upset. 11/08/20   Orma Render, NP  pantoprazole (PROTONIX) 40 MG tablet Take 1 tablet (40 mg total) by mouth 2 (two) times daily. 11/14/21   Orma Render, NP  Psyllium (METAMUCIL) 0.36 g CAPS 1 each by Other route 2 (two) times daily. 08/23/20   [provider]  tirzepatide Darcel Bayley) 10 MG/0.5ML Pen Inject 10 mg into the skin once a week. 11/14/21   Orma Render, NP  valsartan-hydrochlorothiazide (DIOVAN-HCT) 160-25 MG tablet Take 1 tablet by mouth daily. 11/14/21   Orma Render, NP      Allergies    Penicillins    Review of Systems   Review of Systems  Genitourinary:  Positive for vaginal bleeding.  Neurological:  Positive for weakness.  All other systems reviewed and are negative.   Physical Exam Updated Vital Signs BP (!) 175/116   Pulse 70   Temp 98.2 F (36.8 C) (Oral)   Resp 16   SpO2 100%  Physical Exam Vitals and nursing note reviewed.  Constitutional:      Appearance: Normal appearance.  HENT:     Head: Normocephalic.     Nose: Nose normal.     Mouth/Throat:     Mouth: Mucous membranes are moist.  Eyes:     Extraocular Movements: Extraocular movements intact.     Pupils: Pupils are equal, round, and reactive to light.  Cardiovascular:     Rate and Rhythm: Normal rate and regular rhythm.     Pulses: Normal pulses.     Heart sounds: Normal heart sounds.  Pulmonary:     Effort: Pulmonary effort is normal.     Breath sounds: Normal breath sounds.  Abdominal:     General: Abdomen is flat.     Palpations: Abdomen is soft.  Genitourinary:    Comments: Deferred Musculoskeletal:     Cervical back: Normal range of motion and neck supple.  Skin:    General: Skin is  warm.     Capillary Refill: Capillary refill takes less than 2 seconds.  Neurological:     General: No focal deficit present.     Mental Status: She is alert and oriented to person, place, and time.  Psychiatric:        Mood and Affect: Mood normal.        Behavior: Behavior normal.     ED Results / Procedures / Treatments   Labs (all labs ordered are listed, but only abnormal results are displayed) Labs Reviewed  COMPREHENSIVE METABOLIC PANEL - Abnormal; Notable for the following components:      Result Value   Potassium 3.1 (*)    All other components within normal limits  CBC  I-STAT BETA HCG BLOOD, ED ([facility], [facility], [facility] ONLY)  POC OCCULT BLOOD, ED  TYPE AND SCREEN    EKG None  Radiology No results found.  Procedures Procedures    Medications Ordered in ED Medications  potassium chloride SA (KLOR-CON M) CR tablet 40 mEq (has no administration in time range)    ED Course/ Medical Decision Making/ A&P                           Medical Decision Making Samantha Simpson is a 51 y.o. female here presenting with vaginal bleeding and possible anemia.  Patient appears well.  Patient has hemoglobin of 13 in the ED.  Patient does not have any active bleeding.  Her hCG is negative.  I think she is likely perimenopausal.  She did not have Pap smear for several years and I recommend she follows up with GYN to get a Pap CRC screening for cervical cancer.  But I think her bleeding likely is expected perimenopause versus fibroids.  At this point, she can follow-up with outpatient GYN  Problems Addressed: Vaginal bleeding: acute illness or injury  Amount and/or Complexity of Data Reviewed Labs: ordered. Decision-making details documented in ED Course.  Risk Prescription drug management.    Final Clinical Impression(s) / ED Diagnoses Final diagnoses:  None    Rx / DC Orders ED Discharge Orders     None         Drenda Freeze, MD 01/05/22 1512

## 2022-01-05 NOTE — Assessment & Plan Note (Signed)
Sudden onset of severe lethargy and weakness with vision changes in the setting of heavy menstrual bleeding for 4 days. Blood pressure monitored x2 in the office and this is stable. HR normal with no adventitious sounds present. She is clearly weak, but alert and oriented. Given the severity of her symptoms and presentation, I feel that immediate evaluation in the ED is the best next step. At this time her vital signs are stable but I am very concerned that this could change given her presentation. Discussed with Kadija and [name] my recommendations to go to the ED where she can have access to immediate laboratory analysis, IV fluids, blood products, and imaging if necessary. They agree. [facility] unable to give blood products at this time- recommend [facility] based on wait time. [name] is in agreement to drive her to [facility] for evaluation. Vitals remain stable prior to leaving the office and patient able to walk with 1 arm assist, although weak. Will follow.

## 2022-01-05 NOTE — ED Provider Triage Note (Signed)
Emergency Medicine Provider Triage Evaluation Note  Samantha Simpson , a 51 y.o. female  was evaluated in triage.  Pt complains of heavy vaginal bleeding. Feeling weak and light headed. Sent in by pcp for eval .  Review of Systems  Positive: sob Negative: fever  Physical Exam  BP (!) 175/116   Pulse 70   Temp 98.2 F (36.8 C) (Oral)   Resp 16   SpO2 100%  Gen:   Awake, no distress   Resp:  Normal effort  MSK:   Moves extremities without difficulty  Other:  Vaginal bleeding   Medical Decision Making  Medically screening exam initiated at 1:38 PM.  Appropriate orders placed.  Samantha Simpson was informed that the remainder of the evaluation will be completed by another provider, this initial triage assessment does not replace that evaluation, and the importance of remaining in the ED until their evaluation is complete.  work up [employer], [place], PA-C 01/05/22 1340

## 2022-01-05 NOTE — Progress Notes (Signed)
Orma Render, DNP, AGNP-c Primary Care & Sports Medicine [address]  [place], [place] [address] [phone]  Subjective:   Samantha Simpson is a 51 y.o. female presents to day for evaluation of: No chief complaint on file. Samantha Simpson presented today with her husband after he was called to get her from work due to lethargy and weakness.  [name] reports that she took her blood pressure medication first thing this morning instead of waiting until 10 or 11 to do so. She tells me that she feels that she may have dropped her blood pressure.  [name] (husband) reports that Samantha Simpson started menstruating on Monday after 3 months without menses. Her current level of bleeding is very heavy. [name] tells me that she is going through 7-8 super tampons and 7-8 pads per day.  She denies shortness of breath, palpitations, nausea She endorses weakness, lethargy, fatigue, blurred vision, and dizziness.   PMH, Medications, and Allergies reviewed and updated in chart as appropriate.   ROS negative except for what is listed in HPI. Objective:  There were no vitals taken for this visit. Physical Exam Vitals and nursing note reviewed.  Constitutional:      General: She is in acute distress.     Appearance: She is ill-appearing.  HENT:     Head: Normocephalic.  Eyes:     Extraocular Movements: Extraocular movements intact.     Pupils: Pupils are equal, round, and reactive to light.  Cardiovascular:     Rate and Rhythm: Normal rate and regular rhythm.     Pulses: Normal pulses.     Heart sounds: Normal heart sounds.  Pulmonary:     Effort: Pulmonary effort is normal.     Breath sounds: Normal breath sounds.  Musculoskeletal:     Cervical back: Normal range of motion.  Skin:    General: Skin is warm and dry.     Capillary Refill: Capillary refill takes less than 2 seconds.  Neurological:     Mental Status: She is oriented to person, place, and time.      Motor: Weakness present.     Coordination: Coordination abnormal.     Gait: Gait abnormal.           Assessment & Plan:   Problem List Items Addressed This Visit     Lethargy - Primary    Sudden onset of severe lethargy and weakness with vision changes in the setting of heavy menstrual bleeding for 4 days. Blood pressure monitored x2 in the office and this is stable. HR normal with no adventitious sounds present. She is clearly weak, but alert and oriented. Given the severity of her symptoms and presentation, I feel that immediate evaluation in the ED is the best next step. At this time her vital signs are stable but I am very concerned that this could change given her presentation. Discussed with Cotina and [name] my recommendations to go to the ED where she can have access to immediate laboratory analysis, IV fluids, blood products, and imaging if necessary. They agree. [facility] unable to give blood products at this time- recommend [facility] based on wait time. [name] is in agreement to drive her to [facility] for evaluation. Vitals remain stable prior to leaving the office and patient able to walk with 1 arm assist, although weak. Will follow.          Orma Render, DNP, AGNP-c 01/05/2022  9:06 PM    History, Medications, Surgery, SDOH,  and Family History reviewed and updated as appropriate.

## 2022-01-11 ENCOUNTER — Other Ambulatory Visit (HOSPITAL_BASED_OUTPATIENT_CLINIC_OR_DEPARTMENT_OTHER): Payer: Self-pay

## 2022-01-12 ENCOUNTER — Other Ambulatory Visit (HOSPITAL_BASED_OUTPATIENT_CLINIC_OR_DEPARTMENT_OTHER): Payer: Self-pay | Admitting: Nurse Practitioner

## 2022-01-12 ENCOUNTER — Encounter (HOSPITAL_BASED_OUTPATIENT_CLINIC_OR_DEPARTMENT_OTHER): Payer: Self-pay | Admitting: Pharmacist

## 2022-01-12 ENCOUNTER — Other Ambulatory Visit (HOSPITAL_BASED_OUTPATIENT_CLINIC_OR_DEPARTMENT_OTHER): Payer: Self-pay

## 2022-01-12 DIAGNOSIS — E1165 Type 2 diabetes mellitus with hyperglycemia: Secondary | ICD-10-CM

## 2022-01-12 DIAGNOSIS — Z6841 Body Mass Index (BMI) 40.0 and over, adult: Secondary | ICD-10-CM

## 2022-01-12 MED ORDER — TIRZEPATIDE 12.5 MG/0.5ML ~~LOC~~ SOAJ
12.5000 mg | SUBCUTANEOUS | 3 refills | Status: DC
  Filled 2022-01-12: qty 2, 28d supply, fill #0
  Filled 2022-02-05: qty 2, 28d supply, fill #1
  Filled 2022-03-05: qty 2, 28d supply, fill #2
  Filled 2022-04-02: qty 2, 28d supply, fill #3
  Filled 2022-05-13: qty 2, 28d supply, fill #4
  Filled 2022-06-10: qty 2, 28d supply, fill #5
  Filled 2022-07-11 – 2022-07-28 (×2): qty 2, 28d supply, fill #6

## 2022-01-13 ENCOUNTER — Other Ambulatory Visit (HOSPITAL_BASED_OUTPATIENT_CLINIC_OR_DEPARTMENT_OTHER): Payer: Self-pay

## 2022-02-05 ENCOUNTER — Other Ambulatory Visit (HOSPITAL_BASED_OUTPATIENT_CLINIC_OR_DEPARTMENT_OTHER): Payer: Self-pay

## 2022-02-06 ENCOUNTER — Other Ambulatory Visit (HOSPITAL_BASED_OUTPATIENT_CLINIC_OR_DEPARTMENT_OTHER): Payer: Self-pay

## 2022-03-06 ENCOUNTER — Other Ambulatory Visit (HOSPITAL_BASED_OUTPATIENT_CLINIC_OR_DEPARTMENT_OTHER): Payer: Self-pay

## 2022-03-08 ENCOUNTER — Other Ambulatory Visit: Payer: Self-pay | Admitting: Nurse Practitioner

## 2022-03-08 DIAGNOSIS — E11649 Type 2 diabetes mellitus with hypoglycemia without coma: Secondary | ICD-10-CM

## 2022-03-22 ENCOUNTER — Other Ambulatory Visit (HOSPITAL_BASED_OUTPATIENT_CLINIC_OR_DEPARTMENT_OTHER): Payer: Self-pay

## 2022-04-03 ENCOUNTER — Other Ambulatory Visit (HOSPITAL_BASED_OUTPATIENT_CLINIC_OR_DEPARTMENT_OTHER): Payer: Self-pay

## 2022-04-04 ENCOUNTER — Other Ambulatory Visit (HOSPITAL_BASED_OUTPATIENT_CLINIC_OR_DEPARTMENT_OTHER): Payer: Self-pay

## 2022-04-17 ENCOUNTER — Other Ambulatory Visit (HOSPITAL_BASED_OUTPATIENT_CLINIC_OR_DEPARTMENT_OTHER): Payer: Self-pay

## 2022-04-18 ENCOUNTER — Encounter: Payer: Self-pay | Admitting: Internal Medicine

## 2022-04-20 ENCOUNTER — Other Ambulatory Visit (HOSPITAL_BASED_OUTPATIENT_CLINIC_OR_DEPARTMENT_OTHER): Payer: Self-pay

## 2022-05-13 ENCOUNTER — Other Ambulatory Visit (HOSPITAL_BASED_OUTPATIENT_CLINIC_OR_DEPARTMENT_OTHER): Payer: Self-pay

## 2022-05-14 ENCOUNTER — Other Ambulatory Visit (HOSPITAL_BASED_OUTPATIENT_CLINIC_OR_DEPARTMENT_OTHER): Payer: Self-pay

## 2022-05-15 ENCOUNTER — Other Ambulatory Visit: Payer: Self-pay

## 2022-05-17 ENCOUNTER — Ambulatory Visit (HOSPITAL_BASED_OUTPATIENT_CLINIC_OR_DEPARTMENT_OTHER): Payer: BC Managed Care – PPO | Admitting: Nurse Practitioner

## 2022-05-18 ENCOUNTER — Other Ambulatory Visit (HOSPITAL_BASED_OUTPATIENT_CLINIC_OR_DEPARTMENT_OTHER): Payer: Self-pay

## 2022-05-18 ENCOUNTER — Ambulatory Visit (HOSPITAL_BASED_OUTPATIENT_CLINIC_OR_DEPARTMENT_OTHER): Payer: BC Managed Care – PPO | Admitting: Family Medicine

## 2022-06-26 ENCOUNTER — Encounter (HOSPITAL_BASED_OUTPATIENT_CLINIC_OR_DEPARTMENT_OTHER): Payer: Self-pay

## 2022-06-26 ENCOUNTER — Encounter (HOSPITAL_BASED_OUTPATIENT_CLINIC_OR_DEPARTMENT_OTHER): Payer: Self-pay | Admitting: Family Medicine

## 2022-06-26 ENCOUNTER — Other Ambulatory Visit (HOSPITAL_BASED_OUTPATIENT_CLINIC_OR_DEPARTMENT_OTHER): Payer: Self-pay

## 2022-06-26 ENCOUNTER — Ambulatory Visit (HOSPITAL_BASED_OUTPATIENT_CLINIC_OR_DEPARTMENT_OTHER): Payer: BC Managed Care – PPO | Admitting: Family Medicine

## 2022-06-26 VITALS — BP 129/84 | HR 78 | Ht 65.0 in | Wt 205.6 lb

## 2022-06-26 DIAGNOSIS — I152 Hypertension secondary to endocrine disorders: Secondary | ICD-10-CM

## 2022-06-26 DIAGNOSIS — E349 Endocrine disorder, unspecified: Secondary | ICD-10-CM | POA: Diagnosis not present

## 2022-06-26 DIAGNOSIS — Z0000 Encounter for general adult medical examination without abnormal findings: Secondary | ICD-10-CM | POA: Diagnosis not present

## 2022-06-26 DIAGNOSIS — K219 Gastro-esophageal reflux disease without esophagitis: Secondary | ICD-10-CM | POA: Diagnosis not present

## 2022-06-26 DIAGNOSIS — E119 Type 2 diabetes mellitus without complications: Secondary | ICD-10-CM

## 2022-06-26 DIAGNOSIS — I10 Essential (primary) hypertension: Secondary | ICD-10-CM

## 2022-06-26 MED ORDER — AMLODIPINE BESYLATE 5 MG PO TABS
10.0000 mg | ORAL_TABLET | Freq: Every day | ORAL | 1 refills | Status: DC
  Filled 2022-06-26: qty 90, 45d supply, fill #0
  Filled 2022-10-14: qty 90, 45d supply, fill #1

## 2022-06-26 MED ORDER — FREESTYLE LIBRE 14 DAY SENSOR MISC
1.0000 | 11 refills | Status: DC
  Filled 2022-06-26: qty 2, 28d supply, fill #0

## 2022-06-26 MED ORDER — FREESTYLE LIBRE 14 DAY READER DEVI
1.0000 | 11 refills | Status: DC
  Filled 2022-06-26: qty 1, fill #0

## 2022-06-26 NOTE — Progress Notes (Unsigned)
Established Patient Office Visit Follow-Up For HTN, DM, & Weight Loss  Subjective   Patient ID: Samantha Simpson, female    DOB: 09-07-1970  Age: 52 y.o. MRN: [id number]  DM- havent checked fasting blood glucose lately. Reports a few weeks ago, her FGL was in the 80-90s, sometimes 100mg /dL.  Needs refill of her FreeStyle Libre sensor and receiver for continuous blood glucose monitor.    HTN- Currently taking amlodipine 10mg  daily and valsartan-hydrochlorothiazide 160-25mg  daily.  In the morning when she wakes up, feels slightly lightheaded when getting up. She reports that she has to change positions slowly to avoid significant dizziness.  110-120s/80s   Wt loss- currently taking 12.mg Mounjaro without side effects   GERD- Well controlled at this time on protonix 40mg  BID. PPI needs refilled    Review of Systems  Eyes:  Negative for blurred vision and double vision.  Respiratory:  Negative for cough and shortness of breath.   Cardiovascular:  Negative for chest pain and palpitations.  Gastrointestinal:  Negative for abdominal pain, nausea and vomiting.  Neurological:  Negative for dizziness and headaches.      Objective:     BP 129/84   Pulse 78   Ht 5\' 5"  (1.651 m)   Wt 205 lb 9.6 oz (93.3 kg)   SpO2 100%   BMI 34.21 kg/m  BP Readings from Last 3 Encounters:  06/26/22 129/84  01/05/22 119/88  11/14/21 122/78    Last Weight  Most recent update: 06/26/2022  1:40 PM    Weight  93.3 kg (205 lb 9.6 oz)            Past Weight:  Wt Readings from Last 3 Encounters:  02/08/21 275 lb 3.2 oz (124.8 kg)  01/13/21 278 lb (126.1 kg)  12/14/20 278 lb (126.1 kg)    Wt Readings from Last 3 Encounters:  10/12/20 292 lb (132.5 kg)  08/26/20 293 lb 3.2 oz (133 kg)  02/26/19 290 lb (131.5 kg)    Physical Exam Cardiovascular:     Rate and Rhythm: Normal rate and regular rhythm.     Pulses: Normal pulses.     Heart sounds: Normal heart sounds.  Pulmonary:      Effort: Pulmonary effort is normal.     Breath sounds: Normal breath sounds.    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 Will adjust amlodipine from 10mg  to 5mg  daily today due to well-controlled blood pressures at home and feelings of lightheadedness with position changes. Advised patient to continue monitoring her BP at home. Educated her that if her BP decides to increase >130/80, then may have to increase dose back to 10mg  amlodipine.       Relevant Medications   amLODipine (NORVASC) 5 MG tablet     Digestive   Gastroesophageal reflux disease    Patient reports continued issues with GERD and adequate relief with current medication regimen- Protonix 40 mg twice daily.  Will provide refill.  Plan to discuss discontinuing this medication at her annual physical exam due to associated long-term risks,  including osteoporosis.      Relevant Medications   pantoprazole (PROTONIX) 40 MG tablet     Endocrine   Controlled type 2 diabetes mellitus without complication, without long-term current use of insulin ([place])    Patient currently taking Mounjaro now 12.5 mg weekly for weight loss.  Reports infrequent nausea but is able to tolerate it.  Started medication in Nov 2022 and has lost a total of around 80lbs. She is continuing with a healthy lifestyle- including eating healthy foods and frequent exercise. Last A1C assessed 11/14/2021. Will recheck today to ensure her diabetes is still well-controlled with Mounjaro.       Relevant Medications   Continuous Blood Gluc Sensor  (FREESTYLE LIBRE 14 DAY SENSOR) MISC   Continuous Blood Gluc Receiver (FREESTYLE LIBRE 14 DAY READER) DEVI   Other Visit Diagnoses     Wellness examination    -  Primary   Patient is fasting today- will obtain labs for future CPE   Relevant Orders   CBC with Differential/Platelet (Completed)   COMPLETE METABOLIC PANEL WITH GFR   Hemoglobin A1c (Completed)   Lipid panel (Completed)   TSH Rfx on Abnormal to Free T4 (Completed)       Return for 1-2 months for CPE with Pap .    Les Pou, FNP

## 2022-06-27 ENCOUNTER — Other Ambulatory Visit (HOSPITAL_BASED_OUTPATIENT_CLINIC_OR_DEPARTMENT_OTHER): Payer: Self-pay

## 2022-06-27 ENCOUNTER — Telehealth (HOSPITAL_BASED_OUTPATIENT_CLINIC_OR_DEPARTMENT_OTHER): Payer: Self-pay

## 2022-06-27 LAB — CBC WITH DIFFERENTIAL/PLATELET
Basophils Absolute: 0 10*3/uL (ref 0.0–0.2)
Basos: 1 %
EOS (ABSOLUTE): 0.1 10*3/uL (ref 0.0–0.4)
Eos: 2 %
Hematocrit: 41.1 % (ref 34.0–46.6)
Hemoglobin: 13.5 g/dL (ref 11.1–15.9)
Immature Grans (Abs): 0 10*3/uL (ref 0.0–0.1)
Immature Granulocytes: 0 %
Lymphocytes Absolute: 2.2 10*3/uL (ref 0.7–3.1)
Lymphs: 32 %
MCH: 31.3 pg (ref 26.6–33.0)
MCHC: 32.8 g/dL (ref 31.5–35.7)
MCV: 95 fL (ref 79–97)
Monocytes Absolute: 0.4 10*3/uL (ref 0.1–0.9)
Monocytes: 6 %
Neutrophils Absolute: 4.2 10*3/uL (ref 1.4–7.0)
Neutrophils: 59 %
Platelets: 365 10*3/uL (ref 150–450)
RBC: 4.31 x10E6/uL (ref 3.77–5.28)
RDW: 12.7 % (ref 11.7–15.4)
WBC: 7 10*3/uL (ref 3.4–10.8)

## 2022-06-27 LAB — LIPID PANEL
Chol/HDL Ratio: 2.6 ratio (ref 0.0–4.4)
Cholesterol, Total: 116 mg/dL (ref 100–199)
HDL: 45 mg/dL (ref >39)
LDL Chol Calc (NIH): 53 mg/dL (ref 0–99)
Triglycerides: 94 mg/dL (ref 0–149)
VLDL Cholesterol Cal: 18 mg/dL (ref 5–40)

## 2022-06-27 LAB — COMPREHENSIVE METABOLIC PANEL
ALT: 14 IU/L (ref 0–32)
AST: 18 IU/L (ref 0–40)
Albumin/Globulin Ratio: 1.6 (ref 1.2–2.2)
Albumin: 4.6 g/dL (ref 3.8–4.9)
Alkaline Phosphatase: 66 IU/L (ref 44–121)
BUN/Creatinine Ratio: 15 (ref 9–23)
BUN: 13 mg/dL (ref 6–24)
Bilirubin Total: 0.5 mg/dL (ref 0.0–1.2)
CO2: 26 mmol/L (ref 20–29)
Calcium: 10.2 mg/dL (ref 8.7–10.2)
Chloride: 100 mmol/L (ref 96–106)
Creatinine, Ser: 0.88 mg/dL (ref 0.57–1.00)
Globulin, Total: 2.8 g/dL (ref 1.5–4.5)
Glucose: 94 mg/dL (ref 70–99)
Potassium: 4.3 mmol/L (ref 3.5–5.2)
Sodium: 140 mmol/L (ref 134–144)
Total Protein: 7.4 g/dL (ref 6.0–8.5)
eGFR: 79 mL/min/{1.73_m2} (ref >59)

## 2022-06-27 LAB — TSH RFX ON ABNORMAL TO FREE T4: TSH: 1.62 u[IU]/mL (ref 0.450–4.500)

## 2022-06-27 LAB — HEMOGLOBIN A1C
Est. average glucose Bld gHb Est-mCnc: 97 mg/dL
Hgb A1c MFr Bld: 5 % (ref 4.8–5.6)

## 2022-06-27 MED ORDER — PANTOPRAZOLE SODIUM 40 MG PO TBEC
40.0000 mg | DELAYED_RELEASE_TABLET | Freq: Two times a day (BID) | ORAL | 2 refills | Status: DC
  Filled 2022-06-27: qty 60, 30d supply, fill #0
  Filled 2022-07-28 – 2022-08-05 (×2): qty 60, 30d supply, fill #1
  Filled 2022-10-14: qty 60, 30d supply, fill #2

## 2022-06-27 NOTE — Assessment & Plan Note (Deleted)
Patient currently taking Mounjaro now 12.5 mg weekly for weight loss.  Reports infrequent nausea but is able to tolerate it.  She reports that she has lost 80 pounds since starting this medication.

## 2022-06-27 NOTE — Assessment & Plan Note (Addendum)
Currently taking amlodipine 10mg  daily and valsartan-hydrochlorothiazide 160-25mg  daily with adequate blood pressure control. Will adjust amlodipine from 10mg  to 5mg  daily today due to well-controlled blood pressures at home and feelings of lightheadedness with position changes. Advised patient to continue monitoring her BP at home. Educated her that if her BP decides to increase >130/80, then may have to increase dose back to 10mg  amlodipine.

## 2022-06-27 NOTE — Assessment & Plan Note (Addendum)
Patient currently taking Mounjaro now 12.5 mg weekly for weight loss.  Reports infrequent nausea but is able to tolerate it.  Started medication in Nov 2022 and has lost a total of around 80lbs. She is continuing with a healthy lifestyle- including eating healthy foods and frequent exercise. Last A1C assessed 11/14/2021. Will recheck today to ensure her diabetes is still well-controlled with Mounjaro.

## 2022-06-27 NOTE — Telephone Encounter (Signed)
Called patient in regards to her [place] being denied by insurance, spoke with pharmacy to see what patients insurance prefers and they prefer Dexcom.  Explained this to the patient, and spoke with the provider regarding this also. Patient gave a verbal understanding, will get Dexcom sent in for patient.

## 2022-06-27 NOTE — Assessment & Plan Note (Signed)
Patient reports continued issues with GERD and adequate relief with current medication regimen- Protonix 40 mg twice daily.  Will provide refill.  Plan to discuss discontinuing this medication at her annual physical exam due to associated long-term risks, including osteoporosis.

## 2022-06-27 NOTE — Addendum Note (Signed)
Addended by: Les Pou on: 06/27/2022 04:55 PM   Modules accepted: Orders

## 2022-06-28 ENCOUNTER — Other Ambulatory Visit (HOSPITAL_BASED_OUTPATIENT_CLINIC_OR_DEPARTMENT_OTHER): Payer: Self-pay

## 2022-06-28 MED ORDER — DEXCOM G7 SENSOR MISC
1.0000 | Freq: Once | 3 refills | Status: DC
  Filled 2022-06-28 – 2022-10-14 (×2): qty 3, 30d supply, fill #0

## 2022-06-28 NOTE — Addendum Note (Signed)
Addended by: Les Pou on: 06/28/2022 09:35 AM   Modules accepted: Orders

## 2022-06-29 ENCOUNTER — Other Ambulatory Visit (HOSPITAL_BASED_OUTPATIENT_CLINIC_OR_DEPARTMENT_OTHER): Payer: Self-pay

## 2022-06-30 ENCOUNTER — Other Ambulatory Visit (HOSPITAL_BASED_OUTPATIENT_CLINIC_OR_DEPARTMENT_OTHER): Payer: Self-pay

## 2022-07-03 ENCOUNTER — Other Ambulatory Visit (HOSPITAL_BASED_OUTPATIENT_CLINIC_OR_DEPARTMENT_OTHER): Payer: Self-pay

## 2022-07-05 ENCOUNTER — Other Ambulatory Visit (HOSPITAL_BASED_OUTPATIENT_CLINIC_OR_DEPARTMENT_OTHER): Payer: Self-pay

## 2022-07-05 DIAGNOSIS — E119 Type 2 diabetes mellitus without complications: Secondary | ICD-10-CM

## 2022-07-05 MED ORDER — BLOOD GLUCOSE MONITORING SUPPL DEVI
0 refills | Status: AC
  Filled 2022-07-05: qty 1, 1d supply, fill #0
  Filled 2022-07-13: qty 1, 30d supply, fill #0

## 2022-07-05 MED ORDER — MICROLET LANCETS MISC
0 refills | Status: AC
  Filled 2022-07-05: qty 100, 50d supply, fill #0
  Filled 2022-07-13: qty 100, 30d supply, fill #0

## 2022-07-05 MED ORDER — LANCET DEVICE MISC
0 refills | Status: AC
  Filled 2022-07-05 – 2022-07-28 (×2): qty 1, fill #0

## 2022-07-05 MED ORDER — BLOOD GLUCOSE TEST VI STRP
ORAL_STRIP | 1 refills | Status: DC
  Filled 2022-07-05 – 2022-07-13 (×2): qty 50, 25d supply, fill #0
  Filled 2022-11-24 – 2023-01-10 (×2): qty 50, 25d supply, fill #1
  Filled 2023-06-20: qty 50, 25d supply, fill #2

## 2022-07-06 ENCOUNTER — Other Ambulatory Visit: Payer: Self-pay

## 2022-07-06 ENCOUNTER — Other Ambulatory Visit (HOSPITAL_BASED_OUTPATIENT_CLINIC_OR_DEPARTMENT_OTHER): Payer: Self-pay

## 2022-07-13 ENCOUNTER — Other Ambulatory Visit (HOSPITAL_BASED_OUTPATIENT_CLINIC_OR_DEPARTMENT_OTHER): Payer: Self-pay

## 2022-07-17 ENCOUNTER — Other Ambulatory Visit (HOSPITAL_BASED_OUTPATIENT_CLINIC_OR_DEPARTMENT_OTHER): Payer: Self-pay

## 2022-07-17 ENCOUNTER — Encounter (HOSPITAL_BASED_OUTPATIENT_CLINIC_OR_DEPARTMENT_OTHER): Payer: Self-pay | Admitting: Family Medicine

## 2022-07-17 MED ORDER — TIRZEPATIDE 10 MG/0.5ML ~~LOC~~ SOAJ
10.0000 mg | SUBCUTANEOUS | 0 refills | Status: DC
  Filled 2022-07-17: qty 2, 28d supply, fill #0
  Filled 2022-10-14: qty 2, 28d supply, fill #1

## 2022-07-28 ENCOUNTER — Other Ambulatory Visit (HOSPITAL_BASED_OUTPATIENT_CLINIC_OR_DEPARTMENT_OTHER): Payer: Self-pay

## 2022-08-05 ENCOUNTER — Other Ambulatory Visit (HOSPITAL_BASED_OUTPATIENT_CLINIC_OR_DEPARTMENT_OTHER): Payer: Self-pay

## 2022-08-09 ENCOUNTER — Other Ambulatory Visit (HOSPITAL_BASED_OUTPATIENT_CLINIC_OR_DEPARTMENT_OTHER): Payer: Self-pay

## 2022-08-14 ENCOUNTER — Encounter (HOSPITAL_BASED_OUTPATIENT_CLINIC_OR_DEPARTMENT_OTHER): Payer: BC Managed Care – PPO | Admitting: Family Medicine

## 2022-09-13 ENCOUNTER — Encounter (HOSPITAL_BASED_OUTPATIENT_CLINIC_OR_DEPARTMENT_OTHER): Payer: BC Managed Care – PPO | Admitting: Family Medicine

## 2022-09-19 ENCOUNTER — Telehealth (HOSPITAL_BASED_OUTPATIENT_CLINIC_OR_DEPARTMENT_OTHER): Payer: Self-pay | Admitting: Family Medicine

## 2022-09-19 NOTE — Telephone Encounter (Signed)
lvm to r/s physican and schedule diabetic retina screening for 09/28/22

## 2022-09-30 ENCOUNTER — Other Ambulatory Visit (HOSPITAL_BASED_OUTPATIENT_CLINIC_OR_DEPARTMENT_OTHER): Payer: Self-pay

## 2022-10-15 ENCOUNTER — Other Ambulatory Visit (HOSPITAL_BASED_OUTPATIENT_CLINIC_OR_DEPARTMENT_OTHER): Payer: Self-pay

## 2022-10-16 ENCOUNTER — Other Ambulatory Visit: Payer: Self-pay

## 2022-11-16 ENCOUNTER — Other Ambulatory Visit (HOSPITAL_BASED_OUTPATIENT_CLINIC_OR_DEPARTMENT_OTHER): Payer: Self-pay

## 2022-11-16 ENCOUNTER — Encounter (HOSPITAL_BASED_OUTPATIENT_CLINIC_OR_DEPARTMENT_OTHER): Payer: Self-pay | Admitting: Family Medicine

## 2022-11-16 ENCOUNTER — Other Ambulatory Visit (HOSPITAL_COMMUNITY)
Admission: RE | Admit: 2022-11-16 | Discharge: 2022-11-16 | Disposition: A | Discharge status: Home / Self Care | Payer: Commercial Managed Care - PPO | Source: Ambulatory Visit | Attending: Family Medicine | Admitting: Family Medicine

## 2022-11-16 ENCOUNTER — Ambulatory Visit (INDEPENDENT_AMBULATORY_CARE_PROVIDER_SITE_OTHER): Payer: Commercial Managed Care - PPO | Admitting: Family Medicine

## 2022-11-16 VITALS — BP 131/88 | HR 81 | Ht 65.0 in | Wt 231.0 lb

## 2022-11-16 DIAGNOSIS — Z23 Encounter for immunization: Secondary | ICD-10-CM | POA: Diagnosis not present

## 2022-11-16 DIAGNOSIS — Z1231 Encounter for screening mammogram for malignant neoplasm of breast: Secondary | ICD-10-CM

## 2022-11-16 DIAGNOSIS — E1169 Type 2 diabetes mellitus with other specified complication: Secondary | ICD-10-CM | POA: Diagnosis not present

## 2022-11-16 DIAGNOSIS — E1165 Type 2 diabetes mellitus with hyperglycemia: Secondary | ICD-10-CM

## 2022-11-16 DIAGNOSIS — Z124 Encounter for screening for malignant neoplasm of cervix: Secondary | ICD-10-CM

## 2022-11-16 DIAGNOSIS — Z7985 Long-term (current) use of injectable non-insulin antidiabetic drugs: Secondary | ICD-10-CM

## 2022-11-16 DIAGNOSIS — I152 Hypertension secondary to endocrine disorders: Secondary | ICD-10-CM

## 2022-11-16 DIAGNOSIS — E119 Type 2 diabetes mellitus without complications: Secondary | ICD-10-CM | POA: Diagnosis not present

## 2022-11-16 DIAGNOSIS — E785 Hyperlipidemia, unspecified: Secondary | ICD-10-CM

## 2022-11-16 DIAGNOSIS — E349 Endocrine disorder, unspecified: Secondary | ICD-10-CM

## 2022-11-16 DIAGNOSIS — I10 Essential (primary) hypertension: Secondary | ICD-10-CM

## 2022-11-16 DIAGNOSIS — Z0000 Encounter for general adult medical examination without abnormal findings: Secondary | ICD-10-CM | POA: Diagnosis not present

## 2022-11-16 DIAGNOSIS — K219 Gastro-esophageal reflux disease without esophagitis: Secondary | ICD-10-CM

## 2022-11-16 MED ORDER — PANTOPRAZOLE SODIUM 40 MG PO TBEC
40.0000 mg | DELAYED_RELEASE_TABLET | Freq: Two times a day (BID) | ORAL | 1 refills | Status: DC
  Filled 2022-11-16: qty 90, 45d supply, fill #0

## 2022-11-16 MED ORDER — VALSARTAN-HYDROCHLOROTHIAZIDE 160-25 MG PO TABS
1.0000 | ORAL_TABLET | Freq: Every day | ORAL | 1 refills | Status: DC
  Filled 2022-11-16: qty 90, 90d supply, fill #0

## 2022-11-16 MED ORDER — TIRZEPATIDE 2.5 MG/0.5ML ~~LOC~~ SOAJ
2.5000 mg | SUBCUTANEOUS | 0 refills | Status: DC
  Filled 2022-11-16: qty 2, 28d supply, fill #0

## 2022-11-16 MED ORDER — AMLODIPINE BESYLATE 5 MG PO TABS
10.0000 mg | ORAL_TABLET | Freq: Every day | ORAL | 1 refills | Status: DC
  Filled 2022-11-16: qty 90, 45d supply, fill #0

## 2022-11-16 MED ORDER — ATORVASTATIN CALCIUM 10 MG PO TABS
10.0000 mg | ORAL_TABLET | Freq: Every day | ORAL | 1 refills | Status: DC
  Filled 2022-11-16 – 2023-01-10 (×2): qty 90, 90d supply, fill #0
  Filled 2023-04-21: qty 90, 90d supply, fill #1

## 2022-11-16 NOTE — Progress Notes (Addendum)
Complete physical exam  Patient: Samantha Simpson   DOB: 08/30/1970   52 y.o. Female  MRN: [id number]  Subjective:   Arneta Mago is a 52 y.o. female who presents today for a complete physical exam. She reports consuming a general diet. She reports she has been off her Mounjaro (was on 12.5 mg weekly) for the past 4 months and has been eating more unhealthy. She has gained about 30lbs back since being off medication. Gym/ health club routine includes home exercises for about 15 minutes for about 3 days. She generally feels well. She reports sleeping well. She does not have additional problems to discuss today. She would like to restart her Mounjaro.   Depression screenings:    11/16/2022    8:58 AM 05/13/2021   12:09 PM 11/08/2020    9:57 AM  Depression screen PHQ 2/9  Decreased Interest 0 0 0  Down, Depressed, Hopeless 1 0 0  PHQ - 2 Score 1 0 0  Altered sleeping 2 0 0  Tired, decreased energy 1 0 0  Change in appetite 2 0 0  Feeling bad or failure about yourself  1 0 0  Trouble concentrating 1 0 0  Moving slowly or fidgety/restless 0 0 0  Suicidal thoughts 0 0 0  PHQ-9 Score 8 0 0  Difficult doing work/chores Not difficult at all Not difficult at all    Anxiety screenings:    11/16/2022    8:58 AM 11/08/2020    9:57 AM 08/26/2020    8:39 AM  GAD 7 : Generalized Anxiety Score  Nervous, Anxious, on Edge 0 0 0  Control/stop worrying 1 0 1  Worry too much - different things 1 0 1  Trouble relaxing 1 0 1  Restless 0 0 0  Easily annoyed or irritable 0 0 1  Afraid - awful might happen 1 0 0  Total GAD 7 Score 4 0 4  Anxiety Difficulty Not difficult at all  Not difficult at all   Vision- April 2023, plans to schedule diabetic retina exam- no changes in vision  Dentist- March 2024, q6 months   Patient Care Team: [name], FNP as PCP - General (Family Medicine)   Outpatient Medications Prior to Visit  Medication Sig   Accu-Chek Softclix Lancets lancets 1  each by Other route QID.   AMBULATORY NON FORMULARY MEDICATION Medication Name: Free Style Libre 3 sensor. No reader required. Dx Code: E11.65   amLODipine (NORVASC) 5 MG tablet Take 2 tablets (10 mg total) by mouth daily.   atorvastatin (LIPITOR) 10 MG tablet Take 1 tablet (10 mg total) by mouth at bedtime.   blood glucose meter kit and supplies KIT Dispense based on patient and insurance preference. Use up to four times daily as directed. Please include lancets, test strips, control solution. Dx: E11.65   Blood Glucose Monitoring Suppl DEVI Test blood sugar twice a day   cholecalciferol (VITAMIN D3) 25 MCG (1000 UNIT) tablet    Cyanocobalamin (VITAMIN B 12 PO)    ferrous sulfate 325 (65 FE) MG EC tablet Take 1 tablet (325 mg total) by mouth daily with breakfast. Take with food to avoid stomach upset. (Patient taking differently: Take 325 mg by mouth 2 (two) times daily. Take with food to avoid stomach upset.)   Glucose Blood (BLOOD GLUCOSE TEST STRIPS) STRP Test blood sugar twice daily   Lancet Device MISC Tes blood sugar twice a day   pantoprazole (PROTONIX) 40 MG tablet Take 1  tablet (40 mg total) by mouth 2 (two) times daily.   Psyllium (METAMUCIL) 0.36 g CAPS 1 each by Other route 2 (two) times daily.   tirzepatide (MOUNJARO) 10 MG/0.5ML Pen Inject 10 mg into the skin once a week.   tirzepatide (MOUNJARO) 12.5 MG/0.5ML Pen Inject 12.5 mg into the skin once a week.   valsartan-hydrochlorothiazide (DIOVAN-HCT) 160-25 MG tablet Take 1 tablet by mouth daily.   Continuous Glucose Sensor (DEXCOM G7 SENSOR) MISC Use as directed.   No facility-administered medications prior to visit.    Review of Systems  Constitutional:  Negative for malaise/fatigue.  Eyes:  Negative for blurred vision and double vision.  Respiratory:  Negative for cough and shortness of breath.   Cardiovascular:  Positive for leg swelling. Negative for chest pain and palpitations.  Gastrointestinal:  Negative for abdominal  pain, nausea and vomiting.  Genitourinary:  Negative for dysuria, frequency and urgency.  Musculoskeletal:  Negative for myalgias.  Neurological:  Negative for dizziness, weakness and headaches.  Psychiatric/Behavioral:  Negative for depression, substance abuse and suicidal ideas. The patient does not have insomnia.        Objective:     BP (!) 130/92   Pulse 81   Ht 5\' 5"  (1.651 m)   Wt 231 lb (104.8 kg)   BMI 38.44 kg/m  BP Readings from Last 3 Encounters:  11/16/22 (!) 130/92  06/26/22 129/84  01/05/22 119/88     Physical Exam Exam conducted with a chaperone present.  Constitutional:      Appearance: Normal appearance. She is obese.  HENT:     Head: Normocephalic.     Right Ear: Tympanic membrane, ear canal and external ear normal.     Left Ear: Tympanic membrane, ear canal and external ear normal.     Nose: Nose normal.     Mouth/Throat:     Mouth: Mucous membranes are moist.     Pharynx: Oropharynx is clear.  Eyes:     Extraocular Movements: Extraocular movements intact.     Pupils: Pupils are equal, round, and reactive to light.  Cardiovascular:     Rate and Rhythm: Normal rate and regular rhythm.     Pulses: Normal pulses.          Radial pulses are 2+ on the right side and 2+ on the left side.       Dorsalis pedis pulses are 2+ on the right side and 2+ on the left side.       Posterior tibial pulses are 2+ on the right side and 2+ on the left side.     Heart sounds: Normal heart sounds.  Pulmonary:     Effort: Pulmonary effort is normal.     Breath sounds: Normal breath sounds.  Chest:     Chest wall: No mass, deformity or swelling.  Breasts:    Tanner Score is 5.     Right: Normal.     Left: Normal.  Abdominal:     General: Bowel sounds are normal.     Palpations: Abdomen is soft.  Genitourinary:    General: Normal vulva.     Exam position: Lithotomy position.     Pubic Area: No rash or pubic lice.      Labia:        Right: No rash, tenderness,  lesion or injury.        Left: No rash, tenderness, lesion or injury.      Vagina: Normal.     Cervix:  Friability and cervical bleeding present.  Musculoskeletal:        General: Normal range of motion.     Cervical back: Normal range of motion.     Right lower leg: 1+ Pitting Edema present.     Left lower leg: 1+ Pitting Edema present.  Lymphadenopathy:     Upper Body:     Right upper body: No supraclavicular, axillary or pectoral adenopathy.     Left upper body: No supraclavicular, axillary or pectoral adenopathy.     Lower Body: No right inguinal adenopathy. No left inguinal adenopathy.  Skin:    General: Skin is warm and dry.  Neurological:     Mental Status: She is alert.  Psychiatric:        Mood and Affect: Mood normal.        Behavior: Behavior normal.        Thought Content: Thought content normal.        Judgment: Judgment normal.         Assessment & Plan:    Routine Health Maintenance and Physical Exam  Health Maintenance  Topic Date Due   Eye exam for diabetics  Never done   Yearly kidney health urinalysis for diabetes  Never done   Pap Smear  10/19/2014   Zoster (Shingles) Vaccine (1 of 2) Never done   Complete foot exam   05/11/2022   Mammogram  10/24/2022   Flu Shot  11/09/2022   COVID-19 Vaccine (3 - 2023-24 season) 12/02/2022*   Hemoglobin A1C  12/27/2022   Yearly kidney function blood test for diabetes  06/26/2023   Colon Cancer Screening  01/14/2031   DTaP/Tdap/Td vaccine (2 - Td or Tdap) 11/15/2032   HPV Vaccine  Aged Out   Hepatitis C Screening  Discontinued   HIV Screening  Discontinued  *Topic was postponed. The date shown is not the original due date.    1. Wellness examination Labs reviewed and discussed today. Will obtain updated hemoglobin A1c today and update patient with results.  Review of PMH, FH, SH, medications and HM performed. Preventative care hand-out provided.  Recommend healthy diet.  Recommend approximately 150  minutes/week of moderate intensity exercise. Recommend regular dental and vision exams. Always use seatbelt/lap and shoulder restraints. Recommend using smoke alarms and checking batteries at least twice a year. Recommend using sunscreen when outside. Discussed immunization recommendations for shingles and tetanus vaccines. Patient agreed to proceed with Tdap today and will consider Shingrix at future time.   Discussed colon cancer screening recommendations and options.  Patient is UTD.  Breast exam completed. Breast cancer screening recommendations reviewed. Patient is at the age (age 12-54) where recommendations are to start mammograms annually. Mammogram order placed.  - Tdap vaccine greater than or equal to 7yo IM  2. Screening mammogram for breast cancer Order placed for mammogram to be completed.  - MM 3D SCREENING MAMMOGRAM BILATERAL BREAST  3. Screening for cervical cancer Patient agreeable to complete Pap smear today for cervical cancer screening. Will update patient with results.  - Cytology - PAP  4. Controlled type 2 diabetes mellitus without complication, without long-term current use of insulin (HCC) Patient has history of diabetes mellitus type II and has been well controlled with Mounjaro weekly injections. She reports she has not been on the GLP-1 injection for the past four months, due to change in insurance coverage. Last hemoglobin A1c done 06/26/2022 with results of 5.0. Will recheck hemoglobin A1c today prior to restarting Mounjaro. Patient also due  for urine microalbumin with creatinine and will update patient with results.   - Urine Microalbumin w/creat. ratio - Hemoglobin A1c   Return in about 6 weeks (around 12/28/2022) for Diabetes f/u.   [name], FNP

## 2022-11-17 ENCOUNTER — Encounter (HOSPITAL_BASED_OUTPATIENT_CLINIC_OR_DEPARTMENT_OTHER): Payer: Self-pay

## 2022-11-17 ENCOUNTER — Other Ambulatory Visit (HOSPITAL_BASED_OUTPATIENT_CLINIC_OR_DEPARTMENT_OTHER): Payer: Self-pay

## 2022-11-20 ENCOUNTER — Other Ambulatory Visit (HOSPITAL_BASED_OUTPATIENT_CLINIC_OR_DEPARTMENT_OTHER): Payer: Self-pay | Admitting: Family Medicine

## 2022-11-22 ENCOUNTER — Other Ambulatory Visit (HOSPITAL_BASED_OUTPATIENT_CLINIC_OR_DEPARTMENT_OTHER): Payer: Self-pay

## 2022-11-28 ENCOUNTER — Other Ambulatory Visit (HOSPITAL_BASED_OUTPATIENT_CLINIC_OR_DEPARTMENT_OTHER): Payer: Self-pay

## 2022-12-04 ENCOUNTER — Other Ambulatory Visit (HOSPITAL_BASED_OUTPATIENT_CLINIC_OR_DEPARTMENT_OTHER): Payer: Self-pay

## 2022-12-05 ENCOUNTER — Other Ambulatory Visit (HOSPITAL_BASED_OUTPATIENT_CLINIC_OR_DEPARTMENT_OTHER): Payer: Self-pay

## 2022-12-08 ENCOUNTER — Other Ambulatory Visit (HOSPITAL_BASED_OUTPATIENT_CLINIC_OR_DEPARTMENT_OTHER): Payer: Self-pay

## 2022-12-08 NOTE — Telephone Encounter (Signed)
Called [employer] company about not being able to do prior auth online and let them know the message I received. I did submit a prior auth via telephone for pt's Mounjaro. Have also faxed clinical notes and pt's A1C results for them to be able to review. Sent pt a message to update her on this. Will await status update on the PA.

## 2022-12-08 NOTE — Telephone Encounter (Signed)
Patient needs PA for [facility].

## 2022-12-08 NOTE — Telephone Encounter (Signed)
Checked up front to see if there was a prior auth that had been faxed to the office and I didn't see anything in Kristina's box. I do not have access to see prior auths that are needing to be done on pts of Kristina's on cover my meds either.   [facility] pharmacy staff and they are in the process of faxing the prior auth. Will complete once received.

## 2022-12-13 ENCOUNTER — Other Ambulatory Visit (HOSPITAL_BASED_OUTPATIENT_CLINIC_OR_DEPARTMENT_OTHER): Payer: Self-pay

## 2022-12-14 ENCOUNTER — Other Ambulatory Visit (HOSPITAL_BASED_OUTPATIENT_CLINIC_OR_DEPARTMENT_OTHER): Payer: Self-pay

## 2022-12-29 ENCOUNTER — Other Ambulatory Visit: Payer: Self-pay

## 2022-12-29 ENCOUNTER — Encounter (HOSPITAL_BASED_OUTPATIENT_CLINIC_OR_DEPARTMENT_OTHER): Payer: Self-pay | Admitting: Family Medicine

## 2022-12-29 ENCOUNTER — Ambulatory Visit (HOSPITAL_BASED_OUTPATIENT_CLINIC_OR_DEPARTMENT_OTHER): Payer: Commercial Managed Care - PPO | Admitting: Family Medicine

## 2022-12-29 ENCOUNTER — Other Ambulatory Visit (HOSPITAL_BASED_OUTPATIENT_CLINIC_OR_DEPARTMENT_OTHER): Payer: Self-pay

## 2022-12-29 ENCOUNTER — Ambulatory Visit
Admission: RE | Admit: 2022-12-29 | Discharge: 2022-12-29 | Disposition: A | Discharge status: Home / Self Care | Payer: Commercial Managed Care - PPO | Source: Ambulatory Visit | Attending: Family Medicine | Admitting: Family Medicine

## 2022-12-29 VITALS — BP 128/89 | HR 70 | Ht 65.0 in | Wt 234.0 lb

## 2022-12-29 DIAGNOSIS — K219 Gastro-esophageal reflux disease without esophagitis: Secondary | ICD-10-CM

## 2022-12-29 DIAGNOSIS — N951 Menopausal and female climacteric states: Secondary | ICD-10-CM | POA: Diagnosis not present

## 2022-12-29 DIAGNOSIS — E119 Type 2 diabetes mellitus without complications: Secondary | ICD-10-CM

## 2022-12-29 DIAGNOSIS — R2242 Localized swelling, mass and lump, left lower limb: Secondary | ICD-10-CM | POA: Diagnosis not present

## 2022-12-29 DIAGNOSIS — E349 Endocrine disorder, unspecified: Secondary | ICD-10-CM

## 2022-12-29 DIAGNOSIS — Z7984 Long term (current) use of oral hypoglycemic drugs: Secondary | ICD-10-CM

## 2022-12-29 DIAGNOSIS — I152 Hypertension secondary to endocrine disorders: Secondary | ICD-10-CM

## 2022-12-29 MED ORDER — PANTOPRAZOLE SODIUM 40 MG PO TBEC
40.0000 mg | DELAYED_RELEASE_TABLET | Freq: Two times a day (BID) | ORAL | 2 refills | Status: DC
  Filled 2022-12-29 – 2023-01-23 (×2): qty 90, 45d supply, fill #0
  Filled 2023-03-30: qty 90, 45d supply, fill #1
  Filled 2023-04-21: qty 90, 45d supply, fill #2

## 2022-12-29 MED ORDER — TIRZEPATIDE 5 MG/0.5ML ~~LOC~~ SOAJ
5.0000 mg | SUBCUTANEOUS | 2 refills | Status: DC
  Filled 2022-12-29 – 2023-01-10 (×2): qty 2, 28d supply, fill #0
  Filled 2023-02-26: qty 2, 28d supply, fill #1
  Filled 2023-03-30: qty 2, 28d supply, fill #2

## 2022-12-29 MED ORDER — AMLODIPINE BESYLATE 5 MG PO TABS
10.0000 mg | ORAL_TABLET | Freq: Every day | ORAL | 2 refills | Status: DC
  Filled 2022-12-29: qty 90, 45d supply, fill #0
  Filled 2023-03-30: qty 90, 45d supply, fill #1
  Filled 2023-05-21: qty 90, 45d supply, fill #2

## 2022-12-29 MED ORDER — VALSARTAN-HYDROCHLOROTHIAZIDE 160-25 MG PO TABS
1.0000 | ORAL_TABLET | Freq: Every day | ORAL | 2 refills | Status: DC
  Filled 2022-12-29: qty 90, 90d supply, fill #0
  Filled 2023-04-21: qty 90, 90d supply, fill #1
  Filled 2023-08-15: qty 90, 90d supply, fill #2

## 2022-12-29 NOTE — Assessment & Plan Note (Signed)
Patient reports she has not experienced a menstrual cycle for the past 8 months. Recently, she experienced a menstrual cycle that last about 3 weeks. Denies excessive/heavy bleeding, lightheadedness, dizziness, abdominal pain/pressure, nausea/vomiting, bowel or bladder issues, or vaginal pain. Discussed perimenopause period. Advised patient to reach out if she has another episode of AUB and will order imaging/referral to GYN specialist.

## 2022-12-29 NOTE — Assessment & Plan Note (Signed)
Patient currently controlled with A1c of 5.6 on Mounjaro 2.5mg  weekly. Denies side effects on current dose and would like to increase this dose to 5mg  weekly. BMI 38.94 today- patient would benefit from increasing her dose for more significant weight loss. Plan to follow-up in 6 weeks based on increase in [place].

## 2022-12-29 NOTE — Progress Notes (Signed)
Established Patient Office Visit  Subjective   Patient ID: Samantha Simpson, female    DOB: 12/26/1970  Age: 52 y.o. MRN: [id number]  Chief Complaint  Patient presents with   Dysmenorrhea    Started bleeding lasted 3 weeks, after 8 months of no cycle. Thinks perimenopause   Leg Pain    Left leg, injured leg 10 years ago, has started bothering her again feels super hard/tender   Patient is a 52 year-old female who presents today for diabetes management. She is currently taking Mounjaro 2.5mg  weekly with no side effects. She was diagnosed with type 2 DM 02/08/2021 with hemoglobin A1c of 6.5. Most recent A1c 11/16/22 at 5.6. Patient is tolerating well and is okay with increasing dose.   Menstrual cycle- labor day started and just stopped this past Tuesday 9/17- was "normal bleeding" at first but was like a normal period  Last week was just spotting  Denies severe abdominal pain/cramping, heavy bleeding, changing a few pads/tampons every hour during initial onset.   Left leg pain- hit it on a metal rod about 10 years ago  It hurt for a while then it stopped, she reports that she never got it checked out.    Review of Systems  Constitutional:  Negative for malaise/fatigue.  Eyes:  Negative for blurred vision and double vision.  Respiratory:  Negative for cough and shortness of breath.   Cardiovascular:  Positive for leg swelling. Negative for chest pain and palpitations.  Gastrointestinal:  Negative for abdominal pain, nausea and vomiting.  Neurological:  Negative for dizziness, weakness and headaches.  Psychiatric/Behavioral:  Negative for depression and suicidal ideas. The patient is not nervous/anxious.       Objective:     BP 128/89   Pulse 70   Ht 5\' 5"  (1.651 m)   Wt 234 lb (106.1 kg)   SpO2 100%   BMI 38.94 kg/m  BP Readings from Last 3 Encounters:  12/29/22 128/89  11/16/22 131/88  06/26/22 129/84     Physical Exam Constitutional:      Appearance: Normal  appearance.  Cardiovascular:     Rate and Rhythm: Normal rate and regular rhythm.     Pulses: Normal pulses.          Dorsalis pedis pulses are 2+ on the right side and 2+ on the left side.       Posterior tibial pulses are 2+ on the right side and 2+ on the left side.     Heart sounds: Normal heart sounds.  Pulmonary:     Effort: Pulmonary effort is normal.     Breath sounds: Normal breath sounds.  Musculoskeletal:     Right lower leg: 1+ Edema (non-pitting) present.     Left lower leg: 2+ Pitting Edema present.  Skin:    Coloration: Skin is cyanotic (L lower leg dark purplish discoloration (picture included in media)).       Neurological:     Mental Status: She is alert.  Psychiatric:        Mood and Affect: Mood normal.        Behavior: Behavior normal.       Assessment & Plan:  Controlled type 2 diabetes mellitus without complication, without long-term current use of insulin (HCC) Assessment & Plan: Patient currently controlled with A1c of 5.6 on Mounjaro 2.5mg  weekly. Denies side effects on current dose and would like to increase this dose to 5mg  weekly. BMI 38.94 today- patient would benefit from increasing her  dose for more significant weight loss. Plan to follow-up in 6 weeks based on increase in [place].   Orders: -     Tirzepatide; Inject 5 mg into the skin once a week.  Dispense: 2 mL; Refill: 2  Localized swelling of left lower leg Assessment & Plan: Patient presents today with concerns about left lower leg discoloration and "hardness." She reports that she injured it about 10 years but never got this checked out. Now, she is noticing tenderness to this area and thickening of the skin. Picture placed in media tab of chart. Less concerned for DVT. Appears vascular in nature. Will refer to vein & vascular clinic. Order placed for LLE [place]- will update patient with results.   Orders: -     US Venous Img Lower Unilateral Left (DVT); Future -     Ambulatory referral to  Vascular Surgery  Perimenopause Assessment & Plan: Patient reports she has not experienced a menstrual cycle for the past 8 months. Recently, she experienced a menstrual cycle that last about 3 weeks. Denies excessive/heavy bleeding, lightheadedness, dizziness, abdominal pain/pressure, nausea/vomiting, bowel or bladder issues, or vaginal pain. Discussed perimenopause period. Advised patient to reach out if she has another episode of AUB and will order imaging/referral to GYN specialist.    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 BP currently well-controlled. No changes made to medication regimen.   Orders: -     amLODIPine Besylate; Take 2 tablets (10 mg total) by mouth daily.  Dispense: 90 tablet; Refill: 2 -     Valsartan-hydroCHLOROthiazide; Take 1 tablet by mouth daily.  Dispense: 90 tablet; Refill: 2  Gastroesophageal reflux disease, unspecified whether esophagitis present Assessment & Plan: Patient requesting refill of protonix. Refills provided today.   Orders: -     Pantoprazole Sodium; Take 1 tablet (40 mg total) by mouth 2 (two) times daily.  Dispense: 90 tablet; Refill: 2     Return in about 7 weeks (around 02/16/2023) for Diabetes f/u.    [name], FNP

## 2022-12-29 NOTE — Assessment & Plan Note (Signed)
Patient requesting refill of protonix. Refills provided today.

## 2022-12-29 NOTE — Assessment & Plan Note (Signed)
Patient requesting refill of anti-hypertensive medications. BP currently well-controlled. No changes made to medication regimen.

## 2022-12-29 NOTE — Assessment & Plan Note (Addendum)
Patient presents today with concerns about left lower leg discoloration and "hardness." She reports that she injured it about 10 years but never got this checked out. Now, she is noticing tenderness to this area and thickening of the skin. Picture placed in media tab of chart. Less concerned for DVT. Appears vascular in nature. Will refer to vein & vascular clinic. Order placed for LLE [place]- will update patient with results.

## 2023-01-02 ENCOUNTER — Ambulatory Visit
Admission: RE | Admit: 2023-01-02 | Discharge: 2023-01-02 | Disposition: A | Discharge status: Home / Self Care | Payer: Commercial Managed Care - PPO | Source: Ambulatory Visit | Attending: Family Medicine

## 2023-01-02 DIAGNOSIS — R2242 Localized swelling, mass and lump, left lower limb: Secondary | ICD-10-CM

## 2023-01-04 ENCOUNTER — Other Ambulatory Visit (INDEPENDENT_AMBULATORY_CARE_PROVIDER_SITE_OTHER): Payer: Self-pay | Admitting: Nurse Practitioner

## 2023-01-04 DIAGNOSIS — L819 Disorder of pigmentation, unspecified: Secondary | ICD-10-CM

## 2023-01-05 ENCOUNTER — Ambulatory Visit (INDEPENDENT_AMBULATORY_CARE_PROVIDER_SITE_OTHER): Payer: Commercial Managed Care - PPO | Admitting: Nurse Practitioner

## 2023-01-05 ENCOUNTER — Ambulatory Visit (INDEPENDENT_AMBULATORY_CARE_PROVIDER_SITE_OTHER): Payer: Commercial Managed Care - PPO

## 2023-01-05 ENCOUNTER — Encounter (INDEPENDENT_AMBULATORY_CARE_PROVIDER_SITE_OTHER): Payer: Self-pay | Admitting: Nurse Practitioner

## 2023-01-05 VITALS — BP 128/85 | HR 73 | Resp 16 | Ht 65.5 in | Wt 235.0 lb

## 2023-01-05 DIAGNOSIS — R2242 Localized swelling, mass and lump, left lower limb: Secondary | ICD-10-CM

## 2023-01-05 DIAGNOSIS — E119 Type 2 diabetes mellitus without complications: Secondary | ICD-10-CM

## 2023-01-05 DIAGNOSIS — E1169 Type 2 diabetes mellitus with other specified complication: Secondary | ICD-10-CM | POA: Diagnosis not present

## 2023-01-05 DIAGNOSIS — E785 Hyperlipidemia, unspecified: Secondary | ICD-10-CM

## 2023-01-05 DIAGNOSIS — L819 Disorder of pigmentation, unspecified: Secondary | ICD-10-CM

## 2023-01-06 ENCOUNTER — Encounter (INDEPENDENT_AMBULATORY_CARE_PROVIDER_SITE_OTHER): Payer: Self-pay | Admitting: Nurse Practitioner

## 2023-01-06 NOTE — Progress Notes (Unsigned)
Subjective:    Patient ID: Samantha Simpson, female    DOB: 07-16-1970, 52 y.o.   MRN: [id number] Chief Complaint  Patient presents with  . New Patient (Initial Visit)    Ref Charm Barges consult left lower leg discoloration and swelling    HPI  Review of Systems     Objective:   Physical Exam  BP 128/85 (BP Location: Left Arm)   Pulse 73   Resp 16   Ht 5' 5.5" (1.664 m)   Wt 235 lb (106.6 kg)   LMP 12/22/2022   BMI 38.51 kg/m   Past Medical History:  Diagnosis Date  . Acute bronchitis 06/08/2016  . Allergy   . Anemia   . ASCUS (atypical squamous cells of undetermined significance) on Pap smear 1997  . BV (bacterial vaginosis) 2001  . CIN I (cervical intraepithelial neoplasia I) 2004  . Diabetes mellitus without complication (HCC)   . Exposure to chlamydia, mucopurulent cervitis/nongonococcal urethritis 1997  . GERD (gastroesophageal reflux disease)   . H/O fatigue 2010  . H/O: obesity 1997  . HGSIL (high grade squamous intraepithelial dysplasia)   . Hyperlipidemia   . Hypertension 08/26/2020  . Increased BMI 2006  . Ovarian cyst, left 1996  . Renal stones   . Routine gynecological examination 10/19/2011  . Ureteral stone with hydronephrosis 02/24/2016    Social History   Socioeconomic History  . Marital status: Married    Spouse name: Not on file  . Number of children: Not on file  . Years of education: Not on file  . Highest education level: Some college, no degree  Occupational History  . Not on file  Tobacco Use  . Smoking status: Never  . Smokeless tobacco: Never  Substance and Sexual Activity  . Alcohol use: Never  . Drug use: Never  . Sexual activity: Yes    Birth control/protection: None  Other Topics Concern  . Not on file  Social History Narrative  . Not on file   Social Determinants of Health   Financial Resource Strain: Low Risk  (12/27/2022)   Overall Financial Resource Strain (CARDIA)   . Difficulty of Paying Living Expenses:  Not very hard  Food Insecurity: No Food Insecurity (12/27/2022)   Hunger Vital Sign   . Worried About Programme researcher, broadcasting/film/video in the Last Year: Never true   . Ran Out of Food in the Last Year: Never true  Transportation Needs: No Transportation Needs (12/27/2022)   PRAPARE - Transportation   . Lack of Transportation (Medical): No   . Lack of Transportation (Non-Medical): No  Physical Activity: Insufficiently Active (12/27/2022)   Exercise Vital Sign   . Days of Exercise per Week: 3 days   . Minutes of Exercise per Session: 20 min  Stress: No Stress Concern Present (12/27/2022)   [employer] of Occupational Health - Occupational Stress Questionnaire   . Feeling of Stress : Only a little  Social Connections: Socially Integrated (12/27/2022)   Social Connection and Isolation Panel [NHANES]   . Frequency of Communication with Friends and Family: More than three times a week   . Frequency of Social Gatherings with Friends and Family: Once a week   . Attends Religious Services: More than 4 times per year   . Active Member of Clubs or Organizations: Yes   . Attends Banker Meetings: More than 4 times per year   . Marital Status: Married  Catering manager Violence: Not on file    Past  Surgical History:  Procedure Laterality Date  . CKC    . CYSTOSCOPY WITH STENT PLACEMENT Left 02/23/2016   Procedure: CYSTOSCOPY WITH STENT PLACEMENT;  Surgeon: Heloise Purpura, MD;  Location: [facility];  Service: Urology;  Laterality: Left;  . CYSTOSCOPY WITH URETEROSCOPY, STONE BASKETRY AND STENT PLACEMENT Left 03/13/2016   Procedure: CYSTOSCOPY WITH URETEROSCOPY, STONE BASKETRY AND  left ureter removal of stent;  Surgeon: Heloise Purpura, MD;  Location: [facility];  Service: Urology;  Laterality: Left;  . LAPAROSCOPY  1999   left ovary    Family History  Problem Relation Age of Onset  . Diabetes Mother   . Hypertension Mother   . Colon polyps Brother   . Colon cancer Maternal Aunt   . Rectal cancer  Neg Hx   . Esophageal cancer Neg Hx   . Stomach cancer Neg Hx     Allergies  Allergen Reactions  . Penicillins Rash    Has patient had a PCN reaction causing immediate rash, facial/tongue/throat swelling, SOB or lightheadedness with hypotension: Yes Has patient had a PCN reaction causing severe rash involving mucus membranes or skin necrosis: No Has patient had a PCN reaction that required hospitalization: No Has patient had a PCN reaction occurring within the last 10 years: No If all of the above answers are "NO", then may proceed with Cephalosporin use. (Reports she can sometimes tolerate this)        Latest Ref Rng & Units 06/26/2022    2:33 PM 01/05/2022    1:21 PM 11/14/2021    9:06 AM  CBC  WBC 3.4 - 10.8 x10E3/uL 7.0  8.1  9.3   Hemoglobin 11.1 - 15.9 g/dL 13.0  86.5  78.4   Hematocrit 34.0 - 46.6 % 41.1  40.0  39.7   Platelets 150 - 450 x10E3/uL 365  369  359       CMP     Component Value Date/Time   NA 140 06/26/2022 1433   K 4.3 06/26/2022 1433   CL 100 06/26/2022 1433   CO2 26 06/26/2022 1433   GLUCOSE 94 06/26/2022 1433   GLUCOSE 88 01/05/2022 1321   BUN 13 06/26/2022 1433   CREATININE 0.88 06/26/2022 1433   CREATININE 0.57 10/19/2011 1055   CALCIUM 10.2 06/26/2022 1433   PROT 7.4 06/26/2022 1433   ALBUMIN 4.6 06/26/2022 1433   AST 18 06/26/2022 1433   ALT 14 06/26/2022 1433   ALKPHOS 66 06/26/2022 1433   BILITOT 0.5 06/26/2022 1433   EGFR 79 06/26/2022 1433   GFRNONAA >60 01/05/2022 1321     No results found.     Assessment & Plan:   1. Localized swelling of left lower leg ***  2. Controlled type 2 diabetes mellitus without complication, without long-term current use of insulin (HCC) Continue hypoglycemic medications as already ordered, these medications have been reviewed and there are no changes at this time.  Hgb A1C to be monitored as already arranged by primary service  3. Hyperlipidemia associated with type 2 diabetes mellitus  (HCC) Continue statin as ordered and reviewed, no changes at this time  4. Discoloration of skin of lower leg ***   Current Outpatient Medications on File Prior to Visit  Medication Sig Dispense Refill  . Accu-Chek Softclix Lancets lancets 1 each by Other route QID.    Marland Kitchen AMBULATORY NON FORMULARY MEDICATION Medication Name: Free Style Libre 3 sensor. No reader required. Dx Code: E11.65 2 Units 11  . amLODipine (NORVASC) 5 MG tablet Take  2 tablets (10 mg total) by mouth daily. 90 tablet 2  . atorvastatin (LIPITOR) 10 MG tablet Take 1 tablet (10 mg total) by mouth at bedtime. 90 tablet 1  . blood glucose meter kit and supplies KIT Dispense based on patient and insurance preference. Use up to four times daily as directed. Please include lancets, test strips, control solution. Dx: E11.65 1 each 99  . Blood Glucose Monitoring Suppl DEVI Test blood sugar twice a day 1 each 0  . cholecalciferol (VITAMIN D3) 25 MCG (1000 UNIT) tablet     . Cyanocobalamin (VITAMIN B 12 PO)     . ferrous sulfate 325 (65 FE) MG EC tablet Take 1 tablet (325 mg total) by mouth daily with breakfast. Take with food to avoid stomach upset. (Patient taking differently: Take 325 mg by mouth 3 (three) times daily with meals. Take with food to avoid stomach upset.) 30 tablet 3  . Glucose Blood (BLOOD GLUCOSE TEST STRIPS) STRP Test blood sugar twice daily 180 each 1  . Lancet Device MISC Tes blood sugar twice a day 1 each 0  . pantoprazole (PROTONIX) 40 MG tablet Take 1 tablet (40 mg total) by mouth 2 (two) times daily. 90 tablet 2  . Psyllium (METAMUCIL) 0.36 g CAPS 1 each by Other route 2 (two) times daily.    . tirzepatide (MOUNJARO) 5 MG/0.5ML Pen Inject 5 mg into the skin once a week. 2 mL 2  . valsartan-hydrochlorothiazide (DIOVAN-HCT) 160-25 MG tablet Take 1 tablet by mouth daily. 90 tablet 2  . Continuous Glucose Sensor (DEXCOM G7 SENSOR) MISC Use as directed. 3 each 3   No current facility-administered medications on  file prior to visit.    There are no Patient Instructions on file for this visit. No follow-ups on file.   Georgiana Spinner, NP

## 2023-01-08 LAB — VAS US ABI WITH/WO TBI
Left ABI: 0.97
Right ABI: 0.95

## 2023-01-08 NOTE — Progress Notes (Incomplete)
Subjective:    Patient ID: Samantha Simpson, female    DOB: October 13, 1970, 52 y.o.   MRN: [id number] Chief Complaint  Patient presents with  . New Patient (Initial Visit)    Ref Charm Barges consult left lower leg discoloration and swelling    HPI  Review of Systems  Cardiovascular:  Positive for leg swelling.  Skin:  Positive for color change.  All other systems reviewed and are negative.      Objective:   Physical Exam Vitals reviewed.  HENT:     Head: Normocephalic.  Cardiovascular:     Rate and Rhythm: Normal rate.     Pulses:          Dorsalis pedis pulses are 2+ on the right side and 2+ on the left side.       Posterior tibial pulses are 2+ on the right side and 2+ on the left side.  Pulmonary:     Effort: Pulmonary effort is normal.  Musculoskeletal:     Right lower leg: 1+ Edema present.     Left lower leg: 1+ Edema present.  Skin:    General: Skin is warm and dry.  Neurological:     Mental Status: She is alert and oriented to person, place, and time.  Psychiatric:        Mood and Affect: Mood normal.        Behavior: Behavior normal.        Thought Content: Thought content normal.        Judgment: Judgment normal.     BP 128/85 (BP Location: Left Arm)   Pulse 73   Resp 16   Ht 5' 5.5" (1.664 m)   Wt 235 lb (106.6 kg)   LMP 12/22/2022   BMI 38.51 kg/m   Past Medical History:  Diagnosis Date  . Acute bronchitis 06/08/2016  . Allergy   . Anemia   . ASCUS (atypical squamous cells of undetermined significance) on Pap smear 1997  . BV (bacterial vaginosis) 2001  . CIN I (cervical intraepithelial neoplasia I) 2004  . Diabetes mellitus without complication (HCC)   . Exposure to chlamydia, mucopurulent cervitis/nongonococcal urethritis 1997  . GERD (gastroesophageal reflux disease)   . H/O fatigue 2010  . H/O: obesity 1997  . HGSIL (high grade squamous intraepithelial dysplasia)   . Hyperlipidemia   . Hypertension 08/26/2020  . Increased BMI 2006   . Ovarian cyst, left 1996  . Renal stones   . Routine gynecological examination 10/19/2011  . Ureteral stone with hydronephrosis 02/24/2016    Social History   Socioeconomic History  . Marital status: Married    Spouse name: Not on file  . Number of children: Not on file  . Years of education: Not on file  . Highest education level: Some college, no degree  Occupational History  . Not on file  Tobacco Use  . Smoking status: Never  . Smokeless tobacco: Never  Substance and Sexual Activity  . Alcohol use: Never  . Drug use: Never  . Sexual activity: Yes    Birth control/protection: None  Other Topics Concern  . Not on file  Social History Narrative  . Not on file   Social Determinants of Health   Financial Resource Strain: Low Risk  (12/27/2022)   Overall Financial Resource Strain (CARDIA)   . Difficulty of Paying Living Expenses: Not very hard  Food Insecurity: No Food Insecurity (12/27/2022)   Hunger Vital Sign   . Worried About Radiation protection practitioner  of Food in the Last Year: Never true   . Ran Out of Food in the Last Year: Never true  Transportation Needs: No Transportation Needs (12/27/2022)   PRAPARE - Transportation   . Lack of Transportation (Medical): No   . Lack of Transportation (Non-Medical): No  Physical Activity: Insufficiently Active (12/27/2022)   Exercise Vital Sign   . Days of Exercise per Week: 3 days   . Minutes of Exercise per Session: 20 min  Stress: No Stress Concern Present (12/27/2022)   [employer] of Occupational Health - Occupational Stress Questionnaire   . Feeling of Stress : Only a little  Social Connections: Socially Integrated (12/27/2022)   Social Connection and Isolation Panel [NHANES]   . Frequency of Communication with Friends and Family: More than three times a week   . Frequency of Social Gatherings with Friends and Family: Once a week   . Attends Religious Services: More than 4 times per year   . Active Member of Clubs or  Organizations: Yes   . Attends Banker Meetings: More than 4 times per year   . Marital Status: Married  Catering manager Violence: Not on file    Past Surgical History:  Procedure Laterality Date  . CKC    . CYSTOSCOPY WITH STENT PLACEMENT Left 02/23/2016   Procedure: CYSTOSCOPY WITH STENT PLACEMENT;  Surgeon: Heloise Purpura, MD;  Location: [facility];  Service: Urology;  Laterality: Left;  . CYSTOSCOPY WITH URETEROSCOPY, STONE BASKETRY AND STENT PLACEMENT Left 03/13/2016   Procedure: CYSTOSCOPY WITH URETEROSCOPY, STONE BASKETRY AND  left ureter removal of stent;  Surgeon: Heloise Purpura, MD;  Location: [facility];  Service: Urology;  Laterality: Left;  . LAPAROSCOPY  1999   left ovary    Family History  Problem Relation Age of Onset  . Diabetes Mother   . Hypertension Mother   . Colon polyps Brother   . Colon cancer Maternal Aunt   . Rectal cancer Neg Hx   . Esophageal cancer Neg Hx   . Stomach cancer Neg Hx     Allergies  Allergen Reactions  . Penicillins Rash    Has patient had a PCN reaction causing immediate rash, facial/tongue/throat swelling, SOB or lightheadedness with hypotension: Yes Has patient had a PCN reaction causing severe rash involving mucus membranes or skin necrosis: No Has patient had a PCN reaction that required hospitalization: No Has patient had a PCN reaction occurring within the last 10 years: No If all of the above answers are "NO", then may proceed with Cephalosporin use. (Reports she can sometimes tolerate this)        Latest Ref Rng & Units 06/26/2022    2:33 PM 01/05/2022    1:21 PM 11/14/2021    9:06 AM  CBC  WBC 3.4 - 10.8 x10E3/uL 7.0  8.1  9.3   Hemoglobin 11.1 - 15.9 g/dL 40.9  81.1  91.4   Hematocrit 34.0 - 46.6 % 41.1  40.0  39.7   Platelets 150 - 450 x10E3/uL 365  369  359       CMP     Component Value Date/Time   NA 140 06/26/2022 1433   K 4.3 06/26/2022 1433   CL 100 06/26/2022 1433   CO2 26 06/26/2022 1433    GLUCOSE 94 06/26/2022 1433   GLUCOSE 88 01/05/2022 1321   BUN 13 06/26/2022 1433   CREATININE 0.88 06/26/2022 1433   CREATININE 0.57 10/19/2011 1055   CALCIUM 10.2 06/26/2022 1433   PROT  7.4 06/26/2022 1433   ALBUMIN 4.6 06/26/2022 1433   AST 18 06/26/2022 1433   ALT 14 06/26/2022 1433   ALKPHOS 66 06/26/2022 1433   BILITOT 0.5 06/26/2022 1433   EGFR 79 06/26/2022 1433   GFRNONAA >60 01/05/2022 1321     No results found.     Assessment & Plan:   1. Localized swelling of left lower leg The swelling the patient has in her lower extremities I suspect may be related to venous insufficiency or underlying lymphedema.  I believe that the area of discoloration is more so related to venous stasis and the hardness is likely lipodermatosclerosis.  Will have her return with venous reflux studies in 3 months.  She is advised to begin utilizing medical grade compression stockings to evaluate how this helps with her lower extremity edema and swelling.  2. Controlled type 2 diabetes mellitus without complication, without long-term current use of insulin (HCC) Continue hypoglycemic medications as already ordered, these medications have been reviewed and there are no changes at this time.  Hgb A1C to be monitored as already arranged by primary service  3. Hyperlipidemia associated with type 2 diabetes mellitus (HCC) Continue statin as ordered and reviewed, no changes at this time  4. Discoloration of skin of lower leg Today the patient has normal ABIs however her TBI's are altered.  She has strong triphasic tibial artery waveforms with strongly palpable pulses.  I suspect her TBI's are altered due to vasospasm caused by her feet being wet in the cold temperatures.  It is also possible, given the patient's history of diabetes it could be microvascular disease, but I am less suspicious of this.  When the patient returns in 3 months we will reevaluate ABIs.   Current Outpatient Medications on File  Prior to Visit  Medication Sig Dispense Refill  . Accu-Chek Softclix Lancets lancets 1 each by Other route QID.    Marland Kitchen AMBULATORY NON FORMULARY MEDICATION Medication Name: Free Style Libre 3 sensor. No reader required. Dx Code: E11.65 2 Units 11  . amLODipine (NORVASC) 5 MG tablet Take 2 tablets (10 mg total) by mouth daily. 90 tablet 2  . atorvastatin (LIPITOR) 10 MG tablet Take 1 tablet (10 mg total) by mouth at bedtime. 90 tablet 1  . blood glucose meter kit and supplies KIT Dispense based on patient and insurance preference. Use up to four times daily as directed. Please include lancets, test strips, control solution. Dx: E11.65 1 each 99  . Blood Glucose Monitoring Suppl DEVI Test blood sugar twice a day 1 each 0  . cholecalciferol (VITAMIN D3) 25 MCG (1000 UNIT) tablet     . Cyanocobalamin (VITAMIN B 12 PO)     . ferrous sulfate 325 (65 FE) MG EC tablet Take 1 tablet (325 mg total) by mouth daily with breakfast. Take with food to avoid stomach upset. (Patient taking differently: Take 325 mg by mouth 3 (three) times daily with meals. Take with food to avoid stomach upset.) 30 tablet 3  . Glucose Blood (BLOOD GLUCOSE TEST STRIPS) STRP Test blood sugar twice daily 180 each 1  . Lancet Device MISC Tes blood sugar twice a day 1 each 0  . pantoprazole (PROTONIX) 40 MG tablet Take 1 tablet (40 mg total) by mouth 2 (two) times daily. 90 tablet 2  . Psyllium (METAMUCIL) 0.36 g CAPS 1 each by Other route 2 (two) times daily.    . tirzepatide (MOUNJARO) 5 MG/0.5ML Pen Inject 5 mg into the skin  once a week. 2 mL 2  . valsartan-hydrochlorothiazide (DIOVAN-HCT) 160-25 MG tablet Take 1 tablet by mouth daily. 90 tablet 2  . Continuous Glucose Sensor (DEXCOM G7 SENSOR) MISC Use as directed. 3 each 3   No current facility-administered medications on file prior to visit.    There are no Patient Instructions on file for this visit. No follow-ups on file.   Georgiana Spinner, NP

## 2023-01-10 ENCOUNTER — Other Ambulatory Visit (HOSPITAL_BASED_OUTPATIENT_CLINIC_OR_DEPARTMENT_OTHER): Payer: Self-pay

## 2023-01-13 ENCOUNTER — Other Ambulatory Visit (HOSPITAL_BASED_OUTPATIENT_CLINIC_OR_DEPARTMENT_OTHER): Payer: Self-pay

## 2023-01-22 ENCOUNTER — Encounter (HOSPITAL_BASED_OUTPATIENT_CLINIC_OR_DEPARTMENT_OTHER): Payer: Self-pay | Admitting: Family Medicine

## 2023-01-23 ENCOUNTER — Other Ambulatory Visit (HOSPITAL_BASED_OUTPATIENT_CLINIC_OR_DEPARTMENT_OTHER): Payer: Self-pay

## 2023-01-23 ENCOUNTER — Other Ambulatory Visit: Payer: Self-pay

## 2023-02-09 ENCOUNTER — Ambulatory Visit (HOSPITAL_BASED_OUTPATIENT_CLINIC_OR_DEPARTMENT_OTHER): Payer: Commercial Managed Care - PPO | Admitting: Family Medicine

## 2023-02-26 ENCOUNTER — Ambulatory Visit (HOSPITAL_BASED_OUTPATIENT_CLINIC_OR_DEPARTMENT_OTHER): Payer: Commercial Managed Care - PPO | Admitting: Family Medicine

## 2023-02-26 ENCOUNTER — Other Ambulatory Visit (HOSPITAL_BASED_OUTPATIENT_CLINIC_OR_DEPARTMENT_OTHER): Payer: Self-pay

## 2023-02-26 ENCOUNTER — Encounter (HOSPITAL_BASED_OUTPATIENT_CLINIC_OR_DEPARTMENT_OTHER): Payer: Self-pay | Admitting: Family Medicine

## 2023-02-26 VITALS — BP 128/82 | HR 60 | Ht 65.5 in | Wt 246.0 lb

## 2023-02-26 DIAGNOSIS — Z7985 Long-term (current) use of injectable non-insulin antidiabetic drugs: Secondary | ICD-10-CM

## 2023-02-26 DIAGNOSIS — E119 Type 2 diabetes mellitus without complications: Secondary | ICD-10-CM | POA: Diagnosis not present

## 2023-02-26 DIAGNOSIS — E66813 Obesity, class 3: Secondary | ICD-10-CM | POA: Diagnosis not present

## 2023-02-26 DIAGNOSIS — Z6841 Body Mass Index (BMI) 40.0 and over, adult: Secondary | ICD-10-CM | POA: Diagnosis not present

## 2023-02-26 NOTE — Progress Notes (Signed)
Established Patient Office Visit  Subjective   Patient ID: Samantha Simpson, female    DOB: 1970/04/25  Age: 52 y.o. MRN: [id number]  DIABETES MELLITUS: Samantha Simpson is 52 year old female patient who presents for the medical management of diabetes.  Current diabetes medication regimen: Mounjaro 5mg  once weekly, has not been able to pick up due to shortage  Patient is not really adhering to a diabetic diet.  Patient is walking regularly.  Patient is checking fasting BS regularly. Average: 100; highest is 140s Patient is checking their feet regularly.  Denies polydipsia, polyphagia, polyuria, open wounds or ulcers on feet.   10/25/2020: 7.0% diagnosed with diabetes type II Most recent A1c on 11/16/2022: 5.6%  Lab Results  Component Value Date   HGBA1C 5.6 11/16/2022    05/11/2021 Lab Results  Component Value Date   LABMICR 4.8 11/16/2022    Wt Readings from Last 3 Encounters:  02/26/23 246 lb (111.6 kg)  01/05/23 235 lb (106.6 kg)  12/29/22 234 lb (106.1 kg)   Review of Systems  Constitutional:  Negative for malaise/fatigue.  Eyes:  Negative for blurred vision and double vision.  Respiratory:  Negative for cough and shortness of breath.   Cardiovascular:  Negative for chest pain, palpitations, orthopnea, leg swelling and PND.  Gastrointestinal:  Negative for abdominal pain, constipation, diarrhea, nausea and vomiting.  Musculoskeletal:  Negative for myalgias.  Neurological:  Negative for dizziness, weakness and headaches.  Psychiatric/Behavioral:  Negative for depression and suicidal ideas. The patient is not nervous/anxious and does not have insomnia.       Objective:     BP 128/82   Pulse 60   Ht 5' 5.5" (1.664 m)   Wt 246 lb (111.6 kg)   SpO2 97%   BMI 40.31 kg/m  BP Readings from Last 3 Encounters:  02/26/23 128/82  01/05/23 128/85  12/29/22 128/89     Physical Exam Constitutional:      Appearance: Normal appearance. She is obese.   Cardiovascular:     Rate and Rhythm: Normal rate and regular rhythm.     Pulses: Normal pulses.     Heart sounds: Normal heart sounds.  Pulmonary:     Effort: Pulmonary effort is normal.     Breath sounds: Normal breath sounds.  Neurological:     Mental Status: She is alert.  Psychiatric:        Mood and Affect: Mood normal.        Behavior: Behavior normal.       Assessment & Plan:   Controlled type 2 diabetes mellitus without complication, without long-term current use of insulin (HCC) Assessment & Plan: Patient presents today for follow-up regarding management of diabetes with Mounjaro. She recently increased her dose to 5mg  weekly but has been having a difficult time picking up the medication due to shortage supply. Patient reports tolerating medication without adverse side effects. Will allow patient to be on the 5mg  dose consistently for at least 4 weeks, and will look to initiate higher dose to assit with more weight loss.    Class 3 severe obesity with serious comorbidity and body mass index (BMI) of 40.0 to 44.9 in adult, unspecified obesity type [facility]) Assessment & Plan: Discussed healthy lifestyle modifications- including daily exercise 30-40 minutes 3-4 days per week and healthy food choices. Patient reports she has not been as focused on healthy diet and plans to start eating healthier.      Return in about 3 months (around 05/29/2023) for  Diabetes f/u *due for A1c .    [name], FNP

## 2023-02-26 NOTE — Assessment & Plan Note (Signed)
Patient presents today for follow-up regarding management of diabetes with Mounjaro. She recently increased her dose to 5mg  weekly but has been having a difficult time picking up the medication due to shortage supply. Patient reports tolerating medication without adverse side effects. Will allow patient to be on the 5mg  dose consistently for at least 4 weeks, and will look to initiate higher dose to assit with more weight loss.

## 2023-02-26 NOTE — Patient Instructions (Signed)

## 2023-02-26 NOTE — Assessment & Plan Note (Signed)
Discussed healthy lifestyle modifications- including daily exercise 30-40 minutes 3-4 days per week and healthy food choices. Patient reports she has not been as focused on healthy diet and plans to start eating healthier.

## 2023-03-01 ENCOUNTER — Encounter (HOSPITAL_BASED_OUTPATIENT_CLINIC_OR_DEPARTMENT_OTHER): Payer: Self-pay | Admitting: Family Medicine

## 2023-03-30 ENCOUNTER — Other Ambulatory Visit (HOSPITAL_BASED_OUTPATIENT_CLINIC_OR_DEPARTMENT_OTHER): Payer: Self-pay

## 2023-04-13 ENCOUNTER — Ambulatory Visit (INDEPENDENT_AMBULATORY_CARE_PROVIDER_SITE_OTHER): Payer: Commercial Managed Care - PPO | Admitting: Nurse Practitioner

## 2023-04-13 ENCOUNTER — Encounter (INDEPENDENT_AMBULATORY_CARE_PROVIDER_SITE_OTHER): Payer: Commercial Managed Care - PPO

## 2023-04-21 ENCOUNTER — Other Ambulatory Visit (HOSPITAL_BASED_OUTPATIENT_CLINIC_OR_DEPARTMENT_OTHER): Payer: Self-pay | Admitting: Family Medicine

## 2023-04-21 DIAGNOSIS — E119 Type 2 diabetes mellitus without complications: Secondary | ICD-10-CM

## 2023-04-23 ENCOUNTER — Other Ambulatory Visit (HOSPITAL_BASED_OUTPATIENT_CLINIC_OR_DEPARTMENT_OTHER): Payer: Self-pay

## 2023-04-23 ENCOUNTER — Other Ambulatory Visit: Payer: Self-pay

## 2023-04-24 ENCOUNTER — Other Ambulatory Visit (HOSPITAL_BASED_OUTPATIENT_CLINIC_OR_DEPARTMENT_OTHER): Payer: Self-pay

## 2023-04-24 MED ORDER — MOUNJARO 5 MG/0.5ML ~~LOC~~ SOAJ
5.0000 mg | SUBCUTANEOUS | 2 refills | Status: DC
  Filled 2023-04-24 – 2023-04-27 (×3): qty 2, 28d supply, fill #0
  Filled 2023-05-21: qty 2, 28d supply, fill #1
  Filled 2023-06-20: qty 2, 28d supply, fill #2

## 2023-04-27 ENCOUNTER — Other Ambulatory Visit (HOSPITAL_BASED_OUTPATIENT_CLINIC_OR_DEPARTMENT_OTHER): Payer: Self-pay

## 2023-05-21 ENCOUNTER — Other Ambulatory Visit (INDEPENDENT_AMBULATORY_CARE_PROVIDER_SITE_OTHER): Payer: Self-pay | Admitting: Nurse Practitioner

## 2023-05-21 DIAGNOSIS — R2242 Localized swelling, mass and lump, left lower limb: Secondary | ICD-10-CM

## 2023-05-21 DIAGNOSIS — E119 Type 2 diabetes mellitus without complications: Secondary | ICD-10-CM

## 2023-05-21 DIAGNOSIS — L819 Disorder of pigmentation, unspecified: Secondary | ICD-10-CM

## 2023-05-22 ENCOUNTER — Ambulatory Visit (INDEPENDENT_AMBULATORY_CARE_PROVIDER_SITE_OTHER): Payer: Commercial Managed Care - PPO | Admitting: Nurse Practitioner

## 2023-05-22 ENCOUNTER — Encounter (INDEPENDENT_AMBULATORY_CARE_PROVIDER_SITE_OTHER): Payer: Self-pay | Admitting: Nurse Practitioner

## 2023-05-22 ENCOUNTER — Ambulatory Visit (INDEPENDENT_AMBULATORY_CARE_PROVIDER_SITE_OTHER): Payer: Commercial Managed Care - PPO

## 2023-05-22 VITALS — BP 138/89 | HR 77 | Resp 18 | Ht 65.0 in | Wt 254.8 lb

## 2023-05-22 DIAGNOSIS — R2242 Localized swelling, mass and lump, left lower limb: Secondary | ICD-10-CM | POA: Diagnosis not present

## 2023-05-22 DIAGNOSIS — L819 Disorder of pigmentation, unspecified: Secondary | ICD-10-CM

## 2023-05-22 DIAGNOSIS — E1169 Type 2 diabetes mellitus with other specified complication: Secondary | ICD-10-CM

## 2023-05-22 DIAGNOSIS — E119 Type 2 diabetes mellitus without complications: Secondary | ICD-10-CM

## 2023-05-22 DIAGNOSIS — E785 Hyperlipidemia, unspecified: Secondary | ICD-10-CM

## 2023-05-23 LAB — HM DIABETES EYE EXAM

## 2023-05-23 NOTE — Progress Notes (Signed)
Subjective:    Patient ID: Samantha Simpson, female    DOB: 22-Jan-1971, 53 y.o.   MRN: [id number] Chief Complaint  Patient presents with   Follow-up    3 month follow up with ABI & BIL reflux    Samantha Simpson is a 53 year old female who returns today for evaluation related to swelling and discoloration in her lower extremities.  The patient injured her left ankle years ago and it previously had been an issue until recently.  She has been having some worsening swelling and discoloration of the area.  She has recently worn medical grade compression since her last visit and it has helped the swelling.  She has no claudication-like symptoms or rest pain.  That her previous study she had slightly abnormal ABIs but her feet were extremely cold and wet at that time.  ABI on the right was 0.95 the left and 0.97.  There was a TBI of 0.53 on the right and 0.32 on the left.  Today noninvasive studies show an ABI of 1.16 on the right and 1.24 on the left.  Toe pressures are normal bilaterally.  She has strong triphasic tibial artery waveforms and normal toe waveforms.  Today noninvasive study showed no evidence of DVT or superficial phlebitis bilaterally.  No evidence of deep venous insufficiency or superficial venous reflux bilaterally.  She does have a small amount of superficial venous reflux in the right great saphenous vein and then in the left      Review of Systems  Cardiovascular:  Positive for leg swelling.  Skin:  Positive for color change.  All other systems reviewed and are negative.      Objective:   Physical Exam Vitals reviewed.  HENT:     Head: Normocephalic.  Cardiovascular:     Rate and Rhythm: Normal rate.     Pulses: Normal pulses.          Dorsalis pedis pulses are 2+ on the right side and 2+ on the left side.       Posterior tibial pulses are 2+ on the right side and 2+ on the left side.  Pulmonary:     Effort: Pulmonary effort is normal.  Musculoskeletal:      Right lower leg: 1+ Edema present.     Left lower leg: 1+ Edema present.  Skin:    General: Skin is warm and dry.  Neurological:     Mental Status: She is alert and oriented to person, place, and time.  Psychiatric:        Mood and Affect: Mood normal.        Behavior: Behavior normal.        Thought Content: Thought content normal.        Judgment: Judgment normal.     BP 138/89   Pulse 77   Resp 18   Ht 5\' 5"  (1.651 m)   Wt 254 lb 12.8 oz (115.6 kg)   BMI 42.40 kg/m   Past Medical History:  Diagnosis Date   Acute bronchitis 06/08/2016   Allergy    Anemia    ASCUS (atypical squamous cells of undetermined significance) on Pap smear 1997   BV (bacterial vaginosis) 2001   CIN I (cervical intraepithelial neoplasia I) 2004   Diabetes mellitus without complication (HCC)    Exposure to chlamydia, mucopurulent cervitis/nongonococcal urethritis 1997   GERD (gastroesophageal reflux disease)    H/O fatigue 2010   H/O: obesity 1997   HGSIL (high grade squamous intraepithelial  dysplasia)    Hyperlipidemia    Hypertension 08/26/2020   Increased BMI 2006   Ovarian cyst, left 1996   Renal stones    Routine gynecological examination 10/19/2011   Ureteral stone with hydronephrosis 02/24/2016    Social History   Socioeconomic History   Marital status: Married    Spouse name: Not on file   Number of children: Not on file   Years of education: Not on file   Highest education level: Associate degree: occupational, Scientist, product/process development, or vocational program  Occupational History   Not on file  Tobacco Use   Smoking status: Never   Smokeless tobacco: Never  Substance and Sexual Activity   Alcohol use: Never   Drug use: Never   Sexual activity: Yes    Birth control/protection: None  Other Topics Concern   Not on file  Social History Narrative   Not on file   Social Drivers of Health   Financial Resource Strain: Low Risk  (02/23/2023)   Overall Financial Resource Strain (CARDIA)     Difficulty of Paying Living Expenses: Not very hard  Food Insecurity: No Food Insecurity (02/23/2023)   Hunger Vital Sign    Worried About Running Out of Food in the Last Year: Never true    Ran Out of Food in the Last Year: Never true  Transportation Needs: No Transportation Needs (02/23/2023)   PRAPARE - Administrator, Civil Service (Medical): No    Lack of Transportation (Non-Medical): No  Physical Activity: Insufficiently Active (02/23/2023)   Exercise Vital Sign    Days of Exercise per Week: 2 days    Minutes of Exercise per Session: 20 min  Stress: No Stress Concern Present (02/23/2023)   [employer] of Occupational Health - Occupational Stress Questionnaire    Feeling of Stress : Only a little  Social Connections: Socially Integrated (02/23/2023)   Social Connection and Isolation Panel [NHANES]    Frequency of Communication with Friends and Family: More than three times a week    Frequency of Social Gatherings with Friends and Family: Once a week    Attends Religious Services: More than 4 times per year    Active Member of Clubs or Organizations: Yes    Attends Engineer, structural: More than 4 times per year    Marital Status: Married  Catering manager Violence: Not on file    Past Surgical History:  Procedure Laterality Date   [facility]     CYSTOSCOPY WITH STENT PLACEMENT Left 02/23/2016   Procedure: CYSTOSCOPY WITH STENT PLACEMENT;  Surgeon: Heloise Purpura, MD;  Location: [facility];  Service: Urology;  Laterality: Left;   CYSTOSCOPY WITH URETEROSCOPY, STONE BASKETRY AND STENT PLACEMENT Left 03/13/2016   Procedure: CYSTOSCOPY WITH URETEROSCOPY, STONE BASKETRY AND  left ureter removal of stent;  Surgeon: Heloise Purpura, MD;  Location: [facility];  Service: Urology;  Laterality: Left;   LAPAROSCOPY  1999   left ovary    Family History  Problem Relation Age of Onset   Diabetes Mother    Hypertension Mother    Colon polyps Brother    Colon cancer  Maternal Aunt    Rectal cancer Neg Hx    Esophageal cancer Neg Hx    Stomach cancer Neg Hx     Allergies  Allergen Reactions   Penicillins Rash    Has patient had a PCN reaction causing immediate rash, facial/tongue/throat swelling, SOB or lightheadedness with hypotension: Yes Has patient had a PCN reaction causing severe rash  involving mucus membranes or skin necrosis: No Has patient had a PCN reaction that required hospitalization: No Has patient had a PCN reaction occurring within the last 10 years: No If all of the above answers are "NO", then may proceed with Cephalosporin use. (Reports she can sometimes tolerate this)        Latest Ref Rng & Units 06/26/2022    2:33 PM 01/05/2022    1:21 PM 11/14/2021    9:06 AM  CBC  WBC 3.4 - 10.8 x10E3/uL 7.0  8.1  9.3   Hemoglobin 11.1 - 15.9 g/dL 16.1  09.6  04.5   Hematocrit 34.0 - 46.6 % 41.1  40.0  39.7   Platelets 150 - 450 x10E3/uL 365  369  359       CMP     Component Value Date/Time   NA 140 06/26/2022 1433   K 4.3 06/26/2022 1433   CL 100 06/26/2022 1433   CO2 26 06/26/2022 1433   GLUCOSE 94 06/26/2022 1433   GLUCOSE 88 01/05/2022 1321   BUN 13 06/26/2022 1433   CREATININE 0.88 06/26/2022 1433   CREATININE 0.57 10/19/2011 1055   CALCIUM 10.2 06/26/2022 1433   PROT 7.4 06/26/2022 1433   ALBUMIN 4.6 06/26/2022 1433   AST 18 06/26/2022 1433   ALT 14 06/26/2022 1433   ALKPHOS 66 06/26/2022 1433   BILITOT 0.5 06/26/2022 1433   EGFR 79 06/26/2022 1433   GFRNONAA >60 01/05/2022 1321     No results found.     Assessment & Plan:   1. Localized swelling of left lower leg Today the patient does have evidence of venous insufficiency but a very small amount.  Given the swelling that she has bilaterally I suspect that she does have some mild lymphedema bilaterally.  We discussed endovenous laser ablation versus conservative therapy.  At this time I do not feel that endovenous laser ablation would be extremely beneficial  for her as she does not have any pain or discomfort because of the varicosities currently.  She will continue with conservative therapy including use of medical grade compression, elevation and activity.  Will have her return in 6 months in order to evaluate the swelling and how she has been doing with conservative therapy.  2. Controlled type 2 diabetes mellitus without complication, without long-term current use of insulin (HCC) Continue hypoglycemic medications as already ordered, these medications have been reviewed and there are no changes at this time.  Hgb A1C to be monitored as already arranged by primary service  3. Hyperlipidemia associated with type 2 diabetes mellitus (HCC) Continue statin as ordered and reviewed, no changes at this time  4. Discoloration of skin of lower leg Today the patient's ABIs are normal.  The discoloration she was having in the ankle area is more so related to stasis dermatitis and some lipodermatosclerosis.  She will continue to utilize medical grade compression stockings as noted above.  Current Outpatient Medications on File Prior to Visit  Medication Sig Dispense Refill   Accu-Chek Softclix Lancets lancets 1 each by Other route QID.     AMBULATORY NON FORMULARY MEDICATION Medication Name: Free Style Libre 3 sensor. No reader required. Dx Code: E11.65 2 Units 11   amLODipine (NORVASC) 5 MG tablet Take 2 tablets (10 mg total) by mouth daily. 90 tablet 2   atorvastatin (LIPITOR) 10 MG tablet Take 1 tablet (10 mg total) by mouth at bedtime. 90 tablet 1   blood glucose meter kit and supplies KIT  Dispense based on patient and insurance preference. Use up to four times daily as directed. Please include lancets, test strips, control solution. Dx: E11.65 1 each 99   Blood Glucose Monitoring Suppl DEVI Test blood sugar twice a day 1 each 0   cholecalciferol (VITAMIN D3) 25 MCG (1000 UNIT) tablet      Continuous Glucose Sensor (DEXCOM G7 SENSOR) MISC Use as  directed. 3 each 3   Cyanocobalamin (VITAMIN B 12 PO)      ferrous sulfate 325 (65 FE) MG EC tablet Take 1 tablet (325 mg total) by mouth daily with breakfast. Take with food to avoid stomach upset. (Patient taking differently: Take 325 mg by mouth 3 (three) times daily with meals. Take with food to avoid stomach upset.) 30 tablet 3   Glucose Blood (BLOOD GLUCOSE TEST STRIPS) STRP Test blood sugar twice daily 180 each 1   Lancet Device MISC Tes blood sugar twice a day 1 each 0   pantoprazole (PROTONIX) 40 MG tablet Take 1 tablet (40 mg total) by mouth 2 (two) times daily. 90 tablet 2   Psyllium (METAMUCIL) 0.36 g CAPS 1 each by Other route 2 (two) times daily.     tirzepatide [facility]) 5 MG/0.5ML Pen Inject 5 mg into the skin once a week. 2 mL 2   valsartan-hydrochlorothiazide (DIOVAN-HCT) 160-25 MG tablet Take 1 tablet by mouth daily. 90 tablet 2   No current facility-administered medications on file prior to visit.    There are no Patient Instructions on file for this visit. No follow-ups on file.   Georgiana Spinner, NP

## 2023-05-25 LAB — VAS US ABI WITH/WO TBI
Left ABI: 1.24
Right ABI: 1.16

## 2023-05-29 ENCOUNTER — Encounter: Payer: Self-pay | Admitting: Family Medicine

## 2023-05-29 ENCOUNTER — Encounter (HOSPITAL_BASED_OUTPATIENT_CLINIC_OR_DEPARTMENT_OTHER): Payer: Self-pay | Admitting: *Deleted

## 2023-05-29 ENCOUNTER — Ambulatory Visit (HOSPITAL_BASED_OUTPATIENT_CLINIC_OR_DEPARTMENT_OTHER): Payer: Commercial Managed Care - PPO | Admitting: Family Medicine

## 2023-05-29 VITALS — BP 136/89 | HR 75 | Ht 65.0 in | Wt 253.4 lb

## 2023-05-29 DIAGNOSIS — E785 Hyperlipidemia, unspecified: Secondary | ICD-10-CM

## 2023-05-29 DIAGNOSIS — I152 Hypertension secondary to endocrine disorders: Secondary | ICD-10-CM | POA: Diagnosis not present

## 2023-05-29 DIAGNOSIS — E1169 Type 2 diabetes mellitus with other specified complication: Secondary | ICD-10-CM | POA: Diagnosis not present

## 2023-05-29 DIAGNOSIS — Z7985 Long-term (current) use of injectable non-insulin antidiabetic drugs: Secondary | ICD-10-CM

## 2023-05-29 DIAGNOSIS — E349 Endocrine disorder, unspecified: Secondary | ICD-10-CM

## 2023-05-29 DIAGNOSIS — R6889 Other general symptoms and signs: Secondary | ICD-10-CM

## 2023-05-29 DIAGNOSIS — D509 Iron deficiency anemia, unspecified: Secondary | ICD-10-CM | POA: Diagnosis not present

## 2023-05-29 DIAGNOSIS — E119 Type 2 diabetes mellitus without complications: Secondary | ICD-10-CM | POA: Diagnosis not present

## 2023-05-29 NOTE — Progress Notes (Signed)
Subjective:   Samantha Simpson [birth year] 05/29/2023  Chief Complaint  Patient presents with   Medical Management of Chronic Issues    26-month follow up for diabetes; denies any main concerns for today.    HPI: Samantha Simpson presents today for re-assessment and management of chronic medical conditions.  DIABETES MELLITUS: Samantha Simpson presents for the medical management of diabetes.  Current diabetes medication regimen: Mounjaro 5mg   Patient is  adhering to a diabetic diet.  Patient is  exercising regularly.  Patient is  checking BS regularly. Average: 90-139 Patient is  checking their feet regularly.  Denies polydipsia, polyphagia, polyuria, open wounds or ulcers on feet.   Lab Results  Component Value Date   HGBA1C 5.6 11/16/2022    Foot Exam: 05/29/2023 Lab Results  Component Value Date   LABMICR 4.8 11/16/2022    Wt Readings from Last 3 Encounters:  05/29/23 253 lb 6.4 oz (114.9 kg)  05/22/23 254 lb 12.8 oz (115.6 kg)  02/26/23 246 lb (111.6 kg)    HYPERLIPIDEMIA: Samantha Simpson presents for the medical management of hyperlipidemia.  Patient's current HLD regimen is: Atorvastatin 10mg  Patient is  currently taking prescribed medications for HLD.  Adhering to heathy diet: Yes Exercising regularly: Yes Denies myalgias.  Lab Results  Component Value Date   CHOL 116 06/26/2022   HDL 45 06/26/2022   LDLCALC 53 06/26/2022   TRIG 94 06/26/2022   CHOLHDL 2.6 06/26/2022   HYPERTENSION: Samantha Simpson presents for the medical management of hypertension.  Patient's current hypertension medication regimen is: Amlodipine 5mg , Valsartan- hydrochlorothiazide 160-25 every day  Patient is  currently taking prescribed medications for HTN.  Patient is  regularly keeping a check on BP at home.  Adhering to low sodium diet: Yes Exercising Regularly: Yes Denies headache, dizziness, CP, SHOB, vision changes.   BP Readings from Last  3 Encounters:  05/29/23 136/89  05/22/23 138/89  02/26/23 128/82     IDA:  Samantha Simpson presents for the medical management of Anemia.  Current medication : Ferrous Sulfate 1 tablet TID   Denies bloody stools, hematuria, excessive fatigue, palpitations, pica.  Patient is not seeing hematology for management.    Patient states she is also freezing cold and at times "shaking". She will wear a sweater, use an electric blanket and heater at work and still feel cold. Reports mom had hx of thyroid disease.    Lab Results  Component Value Date   WBC 7.0 06/26/2022   HGB 13.5 06/26/2022   HCT 41.1 06/26/2022   MCV 95 06/26/2022   PLT 365 06/26/2022    Lab Results  Component Value Date   IRON 52 11/14/2021   TIBC 292 11/14/2021   FERRITIN 55 11/14/2021    The following portions of the patient's history were reviewed and updated as appropriate: past medical history, past surgical history, family history, social history, allergies, medications, and problem list.   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 11/08/2020   Anemia 11/08/2020   Obesity 10/19/2011   Past Medical History:  Diagnosis Date   Acute bronchitis  06/08/2016   Allergy    Anemia    ASCUS (atypical squamous cells of undetermined significance) on Pap smear 1997   BV (bacterial vaginosis) 2001   CIN I (cervical intraepithelial neoplasia I) 2004   Diabetes mellitus without complication (HCC)    Exposure to chlamydia, mucopurulent cervitis/nongonococcal urethritis 1997   GERD (gastroesophageal  reflux disease)    H/O fatigue 2010   H/O: obesity 1997   HGSIL (high grade squamous intraepithelial dysplasia)    Hyperlipidemia    Hypertension 08/26/2020   Increased BMI 2006   Ovarian cyst, left 1996   Renal stones    Routine gynecological examination 10/19/2011   Ureteral stone with hydronephrosis 02/24/2016   Past Surgical History:  Procedure Laterality Date   [facility]     CYSTOSCOPY WITH STENT PLACEMENT Left 02/23/2016   Procedure: CYSTOSCOPY WITH STENT PLACEMENT;  Surgeon: Heloise Purpura, MD;  Location: [facility];  Service: Urology;  Laterality: Left;   CYSTOSCOPY WITH URETEROSCOPY, STONE BASKETRY AND STENT PLACEMENT Left 03/13/2016   Procedure: CYSTOSCOPY WITH URETEROSCOPY, STONE BASKETRY AND  left ureter removal of stent;  Surgeon: Heloise Purpura, MD;  Location: [facility];  Service: Urology;  Laterality: Left;   LAPAROSCOPY  1999   left ovary   Family History  Problem Relation Age of Onset   Diabetes Mother    Hypertension Mother    Colon polyps Brother    Colon cancer Maternal Aunt    Rectal cancer Neg Hx    Esophageal cancer Neg Hx    Stomach cancer Neg Hx    Outpatient Medications Prior to Visit  Medication Sig Dispense Refill   Accu-Chek Softclix Lancets lancets 1 each by Other route QID.     amLODipine (NORVASC) 5 MG tablet Take 2 tablets (10 mg total) by mouth daily. 90 tablet 2   atorvastatin (LIPITOR) 10 MG tablet Take 1 tablet (10 mg total) by mouth at bedtime. 90 tablet 1   blood glucose meter kit and supplies KIT Dispense based on patient and insurance preference. Use up to four times daily as directed. Please include lancets, test strips, control solution. Dx: E11.65 1 each 99   Blood Glucose Monitoring Suppl DEVI Test blood sugar twice a day 1 each 0   cholecalciferol (VITAMIN D3) 25 MCG (1000 UNIT) tablet      Cyanocobalamin (VITAMIN B 12 PO)      ferrous sulfate 325 (65 FE) MG EC tablet Take 1 tablet (325 mg total) by mouth daily with breakfast. Take with food to  avoid stomach upset. (Patient taking differently: Take 325 mg by mouth 3 (three) times daily with meals. Take with food to avoid stomach upset.) 30 tablet 3   Glucose Blood (BLOOD GLUCOSE TEST STRIPS) STRP Test blood sugar twice daily 180 each 1   Lancet Device MISC Tes blood sugar twice a day 1 each 0   pantoprazole (PROTONIX) 40 MG tablet Take 1 tablet (40 mg total) by mouth 2 (two) times daily. 90 tablet 2   Psyllium (METAMUCIL) 0.36 g CAPS 1 each by Other route 2 (two) times daily.     tirzepatide [facility]) 5 MG/0.5ML Pen Inject 5 mg into the skin once a week. 2 mL 2   valsartan-hydrochlorothiazide (DIOVAN-HCT) 160-25 MG tablet Take 1 tablet by mouth daily. 90 tablet 2   AMBULATORY NON FORMULARY MEDICATION Medication Name: Free Style Libre 3 sensor. No reader required. Dx Code: E11.65 2 Units 11   Continuous Glucose Sensor (DEXCOM G7 SENSOR) MISC Use as directed.  3 each 3   No facility-administered medications prior to visit.   Allergies  Allergen Reactions   Penicillins Rash    Has patient had a PCN reaction causing immediate rash, facial/tongue/throat swelling, SOB or lightheadedness with hypotension: Yes Has patient had a PCN reaction causing severe rash involving mucus membranes or skin necrosis: No Has patient had a PCN reaction that required hospitalization: No Has patient had a PCN reaction occurring within the last 10 years: No If all of the above answers are "NO", then may proceed with Cephalosporin use. (Reports she can sometimes tolerate this)      ROS: A complete ROS was performed with pertinent positives/negatives noted in the HPI. The remainder of the ROS are negative.    Objective:   Today's Vitals   05/29/23 0802  BP: 136/89  Pulse: 75  SpO2: 100%  Weight: 253 lb 6.4 oz (114.9 kg)  Height: 5\' 5"  (1.651 m)    Physical Exam          GENERAL: Well-appearing, in NAD. Well nourished.  SKIN: Pink, warm and dry.  Head: Normocephalic. NECK: Trachea midline.  Full ROM w/o pain or tenderness.  RESPIRATORY: Chest wall symmetrical. Respirations even and non-labored. Breath sounds clear to auscultation bilaterally.  CARDIAC: S1, S2 present, regular rate and rhythm without murmur or gallops. Peripheral pulses 2+ bilaterally.  MSK: Muscle tone and strength appropriate for age.  EXTREMITIES: Without clubbing, cyanosis, Mild +1 non pitting edema to bilateral lower extremities.  NEUROLOGIC: No motor or sensory deficits. Steady, even gait. C2-C12 intact.  PSYCH/MENTAL STATUS: Alert, oriented x 3. Cooperative, appropriate mood and affect.   Diabetic Foot Exam - Simple   Simple Foot Form Diabetic Foot exam was performed with the following findings: Yes 05/29/2023  8:50 AM  Visual Inspection No deformities, no ulcerations, no other skin breakdown bilaterally: Yes Sensation Testing Intact to touch and monofilament testing bilaterally: Yes Pulse Check Posterior Tibialis and Dorsalis pulse intact bilaterally: Yes Comments      Health Maintenance Due  Topic Date Due   OPHTHALMOLOGY EXAM  Never done   Zoster Vaccines- Shingrix (1 of 2) Never done   HEMOGLOBIN A1C  05/19/2023   Diabetic kidney evaluation - eGFR measurement  06/26/2023      Assessment & Plan:  1. Type 2 diabetes mellitus without complication, without long-term current use of insulin (HCC) (Primary) Well controlled currently per patient's glucose readings. Counseled on dietary changes and regular exercise. Will continue Mounjaro 5mg  and obtain A1C w/ labwork today. Foot exam completed. Recommended Shingrix vaccine.  - Hemoglobin A1c  2. Hyperlipidemia associated with type 2 diabetes mellitus (HCC) Well controlled per previous lipid panel. Will obtain fasting LP today w/ labs and titrate statin therapy if needed.  - Lipid panel  3. 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 Continue current regmin of valsartan-hydrochlorothiazide  and amlodipine. Continue to monitor at home and counseled on lifestyle modifications.  - Comprehensive metabolic panel  4. Iron deficiency anemia, unspecified iron deficiency anemia type Will assess control with CBC and Iron panel today. May consider other form of iron replacement if impaired absorption is present.  - CBC with Differential/Platelet - Iron, TIBC and Ferritin Panel  5. Cold intolerance Will check TSH given cold intolerance and family hx of thyroid disease.  - TSH Rfx on Abnormal to Free T4   No orders of the defined types were placed in this encounter.  Lab Orders  CBC with Differential/Platelet         Comprehensive metabolic panel         Lipid panel         Iron, TIBC and Ferritin Panel         Hemoglobin A1c         TSH Rfx on Abnormal to Free T4      Return in about 6 months (around 11/26/2023) for ANNUAL PHYSICAL (fasting labs at visit) .    Patient to reach out to office if new, worrisome, or unresolved symptoms arise or if no improvement in patient's condition. Patient verbalized understanding and is agreeable to treatment plan. All questions answered to patient's satisfaction.    Hilbert Bible, [place]

## 2023-05-29 NOTE — Patient Instructions (Signed)
Obtain Shingrix vaccine in pharmacy.

## 2023-05-30 ENCOUNTER — Encounter (HOSPITAL_BASED_OUTPATIENT_CLINIC_OR_DEPARTMENT_OTHER): Payer: Self-pay | Admitting: Family Medicine

## 2023-05-30 LAB — HEMOGLOBIN A1C
Est. average glucose Bld gHb Est-mCnc: 117 mg/dL
Hgb A1c MFr Bld: 5.7 % — ABNORMAL HIGH (ref 4.8–5.6)

## 2023-05-30 LAB — CBC WITH DIFFERENTIAL/PLATELET
Basophils Absolute: 0.1 10*3/uL (ref 0.0–0.2)
Basos: 1 %
EOS (ABSOLUTE): 0.1 10*3/uL (ref 0.0–0.4)
Eos: 1 %
Hematocrit: 42.2 % (ref 34.0–46.6)
Hemoglobin: 14.2 g/dL (ref 11.1–15.9)
Immature Grans (Abs): 0 10*3/uL (ref 0.0–0.1)
Immature Granulocytes: 0 %
Lymphocytes Absolute: 2 10*3/uL (ref 0.7–3.1)
Lymphs: 25 %
MCH: 31.7 pg (ref 26.6–33.0)
MCHC: 33.6 g/dL (ref 31.5–35.7)
MCV: 94 fL (ref 79–97)
Monocytes Absolute: 0.6 10*3/uL (ref 0.1–0.9)
Monocytes: 7 %
Neutrophils Absolute: 5.4 10*3/uL (ref 1.4–7.0)
Neutrophils: 66 %
Platelets: 347 10*3/uL (ref 150–450)
RBC: 4.48 x10E6/uL (ref 3.77–5.28)
RDW: 12.1 % (ref 11.7–15.4)
WBC: 8.2 10*3/uL (ref 3.4–10.8)

## 2023-05-30 LAB — COMPREHENSIVE METABOLIC PANEL
ALT: 19 [IU]/L (ref 0–32)
AST: 19 [IU]/L (ref 0–40)
Albumin: 4.2 g/dL (ref 3.8–4.9)
Alkaline Phosphatase: 86 [IU]/L (ref 44–121)
BUN/Creatinine Ratio: 15 (ref 9–23)
BUN: 12 mg/dL (ref 6–24)
Bilirubin Total: 0.5 mg/dL (ref 0.0–1.2)
CO2: 27 mmol/L (ref 20–29)
Calcium: 9.6 mg/dL (ref 8.7–10.2)
Chloride: 100 mmol/L (ref 96–106)
Creatinine, Ser: 0.8 mg/dL (ref 0.57–1.00)
Globulin, Total: 3.2 g/dL (ref 1.5–4.5)
Glucose: 84 mg/dL (ref 70–99)
Potassium: 4.1 mmol/L (ref 3.5–5.2)
Sodium: 143 mmol/L (ref 134–144)
Total Protein: 7.4 g/dL (ref 6.0–8.5)
eGFR: 88 mL/min/{1.73_m2} (ref >59)

## 2023-05-30 LAB — IRON,TIBC AND FERRITIN PANEL
Ferritin: 102 ng/mL (ref 15–150)
Iron Saturation: 35 % (ref 15–55)
Iron: 92 ug/dL (ref 27–159)
Total Iron Binding Capacity: 266 ug/dL (ref 250–450)
UIBC: 174 ug/dL (ref 131–425)

## 2023-05-30 LAB — LIPID PANEL
Chol/HDL Ratio: 2.9 {ratio} (ref 0.0–4.4)
Cholesterol, Total: 137 mg/dL (ref 100–199)
HDL: 47 mg/dL (ref >39)
LDL Chol Calc (NIH): 69 mg/dL (ref 0–99)
Triglycerides: 119 mg/dL (ref 0–149)
VLDL Cholesterol Cal: 21 mg/dL (ref 5–40)

## 2023-05-30 LAB — TSH RFX ON ABNORMAL TO FREE T4: TSH: 3.14 u[IU]/mL (ref 0.450–4.500)

## 2023-05-30 NOTE — Progress Notes (Signed)
Hi Arlynn,  Your iron levels, blood counts are normal and well-controlled on your current iron supplementation.  I did not see any signs of uncontrolled anemia or iron deficiency at this time.  Your thyroid was also well-controlled and I do not see any thyroid related disease.  Your cholesterol is excellent and well-controlled on current medication.  Your kidney, liver and electrolytes were normal.  Your A1c went up slightly to 5.7, but is still well-controlled under the recommended value of 7 with type 2 diabetes.  I do not recommend any medication changes at this time and will follow-up as scheduled.  If you have any questions please let me know

## 2023-06-21 ENCOUNTER — Other Ambulatory Visit (HOSPITAL_BASED_OUTPATIENT_CLINIC_OR_DEPARTMENT_OTHER): Payer: Self-pay

## 2023-06-22 ENCOUNTER — Other Ambulatory Visit (HOSPITAL_BASED_OUTPATIENT_CLINIC_OR_DEPARTMENT_OTHER): Payer: Self-pay | Admitting: Family Medicine

## 2023-06-22 DIAGNOSIS — K219 Gastro-esophageal reflux disease without esophagitis: Secondary | ICD-10-CM

## 2023-06-23 ENCOUNTER — Other Ambulatory Visit (HOSPITAL_BASED_OUTPATIENT_CLINIC_OR_DEPARTMENT_OTHER): Payer: Self-pay

## 2023-06-23 MED ORDER — PANTOPRAZOLE SODIUM 40 MG PO TBEC
40.0000 mg | DELAYED_RELEASE_TABLET | Freq: Two times a day (BID) | ORAL | 2 refills | Status: DC
  Filled 2023-06-23 – 2023-08-15 (×2): qty 90, 45d supply, fill #0
  Filled 2023-09-25: qty 90, 45d supply, fill #1
  Filled 2023-11-21: qty 90, 45d supply, fill #2

## 2023-06-25 ENCOUNTER — Other Ambulatory Visit (HOSPITAL_BASED_OUTPATIENT_CLINIC_OR_DEPARTMENT_OTHER): Payer: Self-pay

## 2023-07-05 ENCOUNTER — Other Ambulatory Visit (HOSPITAL_BASED_OUTPATIENT_CLINIC_OR_DEPARTMENT_OTHER): Payer: Self-pay

## 2023-07-09 ENCOUNTER — Other Ambulatory Visit (HOSPITAL_BASED_OUTPATIENT_CLINIC_OR_DEPARTMENT_OTHER): Payer: Self-pay | Admitting: Family Medicine

## 2023-07-09 ENCOUNTER — Other Ambulatory Visit (HOSPITAL_BASED_OUTPATIENT_CLINIC_OR_DEPARTMENT_OTHER): Payer: Self-pay

## 2023-07-09 DIAGNOSIS — E349 Endocrine disorder, unspecified: Secondary | ICD-10-CM

## 2023-07-09 MED ORDER — AMLODIPINE BESYLATE 5 MG PO TABS
10.0000 mg | ORAL_TABLET | Freq: Every day | ORAL | 2 refills | Status: DC
  Filled 2023-07-09 – 2023-08-15 (×2): qty 90, 45d supply, fill #0
  Filled 2023-09-25: qty 90, 45d supply, fill #1
  Filled 2023-11-21 (×2): qty 90, 45d supply, fill #2

## 2023-07-19 ENCOUNTER — Other Ambulatory Visit (HOSPITAL_BASED_OUTPATIENT_CLINIC_OR_DEPARTMENT_OTHER): Payer: Self-pay

## 2023-08-15 ENCOUNTER — Other Ambulatory Visit (HOSPITAL_BASED_OUTPATIENT_CLINIC_OR_DEPARTMENT_OTHER): Payer: Self-pay | Admitting: Family Medicine

## 2023-08-15 DIAGNOSIS — E1169 Type 2 diabetes mellitus with other specified complication: Secondary | ICD-10-CM

## 2023-08-15 DIAGNOSIS — E349 Endocrine disorder, unspecified: Secondary | ICD-10-CM

## 2023-08-15 DIAGNOSIS — E119 Type 2 diabetes mellitus without complications: Secondary | ICD-10-CM

## 2023-08-15 DIAGNOSIS — E1165 Type 2 diabetes mellitus with hyperglycemia: Secondary | ICD-10-CM

## 2023-08-16 ENCOUNTER — Other Ambulatory Visit (HOSPITAL_BASED_OUTPATIENT_CLINIC_OR_DEPARTMENT_OTHER): Payer: Self-pay

## 2023-08-16 ENCOUNTER — Other Ambulatory Visit: Payer: Self-pay

## 2023-08-16 MED ORDER — MOUNJARO 5 MG/0.5ML ~~LOC~~ SOAJ
5.0000 mg | SUBCUTANEOUS | 2 refills | Status: DC
  Filled 2023-08-16: qty 2, 28d supply, fill #0
  Filled 2023-09-25: qty 2, 28d supply, fill #1
  Filled 2023-10-23: qty 2, 28d supply, fill #2

## 2023-08-16 MED ORDER — ATORVASTATIN CALCIUM 10 MG PO TABS
10.0000 mg | ORAL_TABLET | Freq: Every day | ORAL | 1 refills | Status: DC
  Filled 2023-08-16: qty 90, 90d supply, fill #0
  Filled 2023-11-21 (×2): qty 90, 90d supply, fill #1

## 2023-08-28 ENCOUNTER — Encounter (INDEPENDENT_AMBULATORY_CARE_PROVIDER_SITE_OTHER): Payer: Self-pay

## 2023-09-25 ENCOUNTER — Other Ambulatory Visit (HOSPITAL_BASED_OUTPATIENT_CLINIC_OR_DEPARTMENT_OTHER): Payer: Self-pay | Admitting: Family Medicine

## 2023-09-26 ENCOUNTER — Other Ambulatory Visit (HOSPITAL_BASED_OUTPATIENT_CLINIC_OR_DEPARTMENT_OTHER): Payer: Self-pay

## 2023-09-26 ENCOUNTER — Other Ambulatory Visit: Payer: Self-pay

## 2023-09-27 ENCOUNTER — Other Ambulatory Visit (HOSPITAL_BASED_OUTPATIENT_CLINIC_OR_DEPARTMENT_OTHER): Payer: Self-pay

## 2023-09-27 ENCOUNTER — Other Ambulatory Visit: Payer: Self-pay

## 2023-09-27 MED ORDER — BLOOD GLUCOSE TEST VI STRP
ORAL_STRIP | 1 refills | Status: AC
  Filled 2023-09-27: qty 50, 25d supply, fill #0

## 2023-10-08 ENCOUNTER — Other Ambulatory Visit (HOSPITAL_BASED_OUTPATIENT_CLINIC_OR_DEPARTMENT_OTHER): Payer: Self-pay

## 2023-11-07 ENCOUNTER — Other Ambulatory Visit (HOSPITAL_BASED_OUTPATIENT_CLINIC_OR_DEPARTMENT_OTHER): Payer: Self-pay | Admitting: Family Medicine

## 2023-11-07 DIAGNOSIS — E349 Endocrine disorder, unspecified: Secondary | ICD-10-CM

## 2023-11-19 ENCOUNTER — Encounter (INDEPENDENT_AMBULATORY_CARE_PROVIDER_SITE_OTHER): Payer: Self-pay | Admitting: Nurse Practitioner

## 2023-11-19 ENCOUNTER — Ambulatory Visit (INDEPENDENT_AMBULATORY_CARE_PROVIDER_SITE_OTHER): Payer: Commercial Managed Care - PPO | Admitting: Nurse Practitioner

## 2023-11-19 VITALS — BP 137/97 | HR 85 | Ht 65.0 in | Wt 261.2 lb

## 2023-11-19 DIAGNOSIS — E119 Type 2 diabetes mellitus without complications: Secondary | ICD-10-CM | POA: Diagnosis not present

## 2023-11-19 DIAGNOSIS — E1169 Type 2 diabetes mellitus with other specified complication: Secondary | ICD-10-CM

## 2023-11-19 DIAGNOSIS — I872 Venous insufficiency (chronic) (peripheral): Secondary | ICD-10-CM

## 2023-11-19 DIAGNOSIS — L819 Disorder of pigmentation, unspecified: Secondary | ICD-10-CM

## 2023-11-19 DIAGNOSIS — E785 Hyperlipidemia, unspecified: Secondary | ICD-10-CM

## 2023-11-19 NOTE — Progress Notes (Signed)
 Subjective:    Patient ID: Samantha Simpson, female    DOB: Aug 28, 1970, 53 y.o.   MRN: [id number] Chief Complaint  Patient presents with   Follow-up    6 month follow up     Samantha Simpson is a 53 year old female who returns today for evaluation related to swelling and discoloration in her lower extremities.  The patient injured her left ankle years ago and it previously had been an issue until recently.  The patient returns after about 6 months of doing conservative therapy.  She has been doing very well and diligently wearing her medical grade compression socks.  She also notes that she probably gets about 30 minutes or so of active walking a day by walking her dogs.  In addition she has a device to help with activity at work as well.  Overall her swelling is doing very well with just some puffiness around the ankles.  There has been no worsening stasis dermatitis or any development of open wounds or ulcerations.  She continues to deny any claudication-like symptoms.  She denies any aching or throbbing or pain from the varicosities.     Review of Systems  Cardiovascular:  Positive for leg swelling.  Skin:  Positive for color change.  All other systems reviewed and are negative.      Objective:   Physical Exam Vitals reviewed.  HENT:     Head: Normocephalic.  Cardiovascular:     Rate and Rhythm: Normal rate.     Pulses: Normal pulses.          Dorsalis pedis pulses are 2+ on the right side and 2+ on the left side.       Posterior tibial pulses are 2+ on the right side and 2+ on the left side.  Pulmonary:     Effort: Pulmonary effort is normal.  Musculoskeletal:     Right lower leg: 1+ Edema present.     Left lower leg: 1+ Edema present.  Skin:    General: Skin is warm and dry.  Neurological:     Mental Status: She is alert and oriented to person, place, and time.  Psychiatric:        Mood and Affect: Mood normal.        Behavior: Behavior normal.        Thought  Content: Thought content normal.        Judgment: Judgment normal.     BP (!) 137/97   Pulse 85   Ht 5' 5 (1.651 m)   Wt 261 lb 3.2 oz (118.5 kg)   BMI 43.47 kg/m   Past Medical History:  Diagnosis Date   Acute bronchitis 06/08/2016   Allergy    Anemia    ASCUS (atypical squamous cells of undetermined significance) on Pap smear 1997   BV (bacterial vaginosis) 2001   CIN I (cervical intraepithelial neoplasia I) 2004   Diabetes mellitus without complication (HCC)    Exposure to chlamydia, mucopurulent cervitis/nongonococcal urethritis 1997   GERD (gastroesophageal reflux disease)    H/O fatigue 2010   H/O: obesity 1997   HGSIL (high grade squamous intraepithelial dysplasia)    Hyperlipidemia    Hypertension 08/26/2020   Increased BMI 2006   Ovarian cyst, left 1996   Renal stones    Routine gynecological examination 10/19/2011   Ureteral stone with hydronephrosis 02/24/2016    Social History   Socioeconomic History   Marital status: Married    Spouse name: Not on file  Number of children: Not on file   Years of education: Not on file   Highest education level: Some college, no degree  Occupational History   Not on file  Tobacco Use   Smoking status: Never   Smokeless tobacco: Never  Substance and Sexual Activity   Alcohol use: Never   Drug use: Never   Sexual activity: Yes    Birth control/protection: None  Other Topics Concern   Not on file  Social History Narrative   Not on file   Social Drivers of Health   Financial Resource Strain: Low Risk  (05/29/2023)   Overall Financial Resource Strain (CARDIA)    Difficulty of Paying Living Expenses: Not hard at all  Food Insecurity: No Food Insecurity (05/29/2023)   Hunger Vital Sign    Worried About Running Out of Food in the Last Year: Never true    Ran Out of Food in the Last Year: Never true  Transportation Needs: No Transportation Needs (05/29/2023)   PRAPARE - Administrator, Civil Service  (Medical): No    Lack of Transportation (Non-Medical): No  Physical Activity: Insufficiently Active (05/29/2023)   Exercise Vital Sign    Days of Exercise per Week: 1 day    Minutes of Exercise per Session: 10 min  Stress: No Stress Concern Present (05/29/2023)   [employer] of Occupational Health - Occupational Stress Questionnaire    Feeling of Stress : Only a little  Social Connections: Socially Integrated (05/29/2023)   Social Connection and Isolation Panel    Frequency of Communication with Friends and Family: More than three times a week    Frequency of Social Gatherings with Friends and Family: Once a week    Attends Religious Services: More than 4 times per year    Active Member of Clubs or Organizations: Yes    Attends Banker Meetings: More than 4 times per year    Marital Status: Married  Catering manager Violence: Not At Risk (05/29/2023)   Humiliation, Afraid, Rape, and Kick questionnaire    Fear of Current or Ex-Partner: No    Emotionally Abused: No    Physically Abused: No    Sexually Abused: No    Past Surgical History:  Procedure Laterality Date   [facility]     CYSTOSCOPY WITH STENT PLACEMENT Left 02/23/2016   Procedure: CYSTOSCOPY WITH STENT PLACEMENT;  Surgeon: Gretel Ferrara, MD;  Location: [facility];  Service: Urology;  Laterality: Left;   CYSTOSCOPY WITH URETEROSCOPY, STONE BASKETRY AND STENT PLACEMENT Left 03/13/2016   Procedure: CYSTOSCOPY WITH URETEROSCOPY, STONE BASKETRY AND  left ureter removal of stent;  Surgeon: Gretel Ferrara, MD;  Location: [facility];  Service: Urology;  Laterality: Left;   LAPAROSCOPY  1999   left ovary    Family History  Problem Relation Age of Onset   Diabetes Mother    Hypertension Mother    Colon polyps Brother    Colon cancer Maternal Aunt    Rectal cancer Neg Hx    Esophageal cancer Neg Hx    Stomach cancer Neg Hx     Allergies  Allergen Reactions   Penicillins Rash    Has patient had a PCN reaction causing  immediate rash, facial/tongue/throat swelling, SOB or lightheadedness with hypotension: Yes Has patient had a PCN reaction causing severe rash involving mucus membranes or skin necrosis: No Has patient had a PCN reaction that required hospitalization: No Has patient had a PCN reaction occurring within the last 10 years: No  If all of the above answers are NO, then may proceed with Cephalosporin use. (Reports she can sometimes tolerate this)        Latest Ref Rng & Units 05/29/2023    8:44 AM 06/26/2022    2:33 PM 01/05/2022    1:21 PM  CBC  WBC 3.4 - 10.8 x10E3/uL 8.2  7.0  8.1   Hemoglobin 11.1 - 15.9 g/dL 85.7  86.4  86.0   Hematocrit 34.0 - 46.6 % 42.2  41.1  40.0   Platelets 150 - 450 x10E3/uL 347  365  369       CMP     Component Value Date/Time   NA 143 05/29/2023 0844   K 4.1 05/29/2023 0844   CL 100 05/29/2023 0844   CO2 27 05/29/2023 0844   GLUCOSE 84 05/29/2023 0844   GLUCOSE 88 01/05/2022 1321   BUN 12 05/29/2023 0844   CREATININE 0.80 05/29/2023 0844   CREATININE 0.57 10/19/2011 1055   CALCIUM  9.6 05/29/2023 0844   PROT 7.4 05/29/2023 0844   ALBUMIN 4.2 05/29/2023 0844   AST 19 05/29/2023 0844   ALT 19 05/29/2023 0844   ALKPHOS 86 05/29/2023 0844   BILITOT 0.5 05/29/2023 0844   EGFR 88 05/29/2023 0844   GFRNONAA >60 01/05/2022 1321     No results found.     Assessment & Plan:    1. Chronic venous insufficiency (Primary) The patient does have evidence of venous insufficiency as noted by previous ultrasounds.  She is doing well with conservative therapy.  We have previously discussed use of endovenous laser ablation and at this time she continues not to have any significant pain or discomfort from the varicosities.  Again, I do not feel that moving forward with an ablation would be extremely beneficial for her at this time given her absence of any pain or discomfort from the varicosities.  I have recommended that she continue with conservative therapy  including the use of medical grade compression, elevation and activity.  She is agreeable to this plan.  Will have her return in 1 year in order to evaluate her progress however she is advised to follow-up with us  sooner if she begins to have any issues.  2. Controlled type 2 diabetes mellitus without complication, without long-term current use of insulin (HCC) Continue hypoglycemic medications as already ordered, these medications have been reviewed and there are no changes at this time.  Hgb A1C to be monitored as already arranged by primary service  3. Hyperlipidemia associated with type 2 diabetes mellitus (HCC) Continue statin as ordered and reviewed, no changes at this time  4. Discoloration of skin of lower leg The patient has had normal ABIs.  She continues to have some stasis dermatitis but it is mild and not worsening.  No evidence of cellulitis noted today.  Current Outpatient Medications on File Prior to Visit  Medication Sig Dispense Refill   Accu-Chek Softclix Lancets lancets 1 each by Other route QID.     amLODipine  (NORVASC ) 5 MG tablet Take 2 tablets (10 mg total) by mouth daily. 90 tablet 2   atorvastatin  (LIPITOR) 10 MG tablet Take 1 tablet (10 mg total) by mouth at bedtime. 90 tablet 1   blood glucose meter kit and supplies KIT Dispense based on patient and insurance preference. Use up to four times daily as directed. Please include lancets, test strips, control solution. Dx: E11.65 1 each 99   Blood Glucose Monitoring Suppl DEVI Test blood sugar twice a  day 1 each 0   cholecalciferol (VITAMIN D3) 25 MCG (1000 UNIT) tablet      Cyanocobalamin (VITAMIN B 12 PO)      ferrous sulfate  325 (65 FE) MG EC tablet Take 1 tablet (325 mg total) by mouth daily with breakfast. Take with food to avoid stomach upset. 30 tablet 3   Glucose Blood (BLOOD GLUCOSE TEST STRIPS) STRP Use as directed to test blood sugar twice daily. 150 each 1   Lancet Device MISC Tes blood sugar twice a day 1  each 0   pantoprazole  (PROTONIX ) 40 MG tablet Take 1 tablet (40 mg total) by mouth 2 (two) times daily. 90 tablet 2   tirzepatide  (MOUNJARO ) 5 MG/0.5ML Pen Inject 5 mg into the skin once a week. 2 mL 2   valsartan -hydrochlorothiazide  (DIOVAN -HCT) 160-25 MG tablet Take 1 tablet by mouth daily. 90 tablet 2   Psyllium (METAMUCIL) 0.36 g CAPS 1 each by Other route 2 (two) times daily. (Patient not taking: Reported on 11/19/2023)     No current facility-administered medications on file prior to visit.    There are no Patient Instructions on file for this visit. No follow-ups on file.   Ahriana Gunkel E Sarahbeth Cashin, NP

## 2023-11-20 ENCOUNTER — Encounter (HOSPITAL_BASED_OUTPATIENT_CLINIC_OR_DEPARTMENT_OTHER): Payer: Commercial Managed Care - PPO | Admitting: Family Medicine

## 2023-11-20 ENCOUNTER — Other Ambulatory Visit (HOSPITAL_BASED_OUTPATIENT_CLINIC_OR_DEPARTMENT_OTHER): Payer: Self-pay

## 2023-11-21 ENCOUNTER — Other Ambulatory Visit: Payer: Self-pay

## 2023-11-21 ENCOUNTER — Other Ambulatory Visit (HOSPITAL_COMMUNITY)
Admission: RE | Admit: 2023-11-21 | Discharge: 2023-11-21 | Disposition: A | Discharge status: Home / Self Care | Source: Ambulatory Visit | Attending: Family Medicine | Admitting: Family Medicine

## 2023-11-21 ENCOUNTER — Other Ambulatory Visit (HOSPITAL_BASED_OUTPATIENT_CLINIC_OR_DEPARTMENT_OTHER): Payer: Self-pay

## 2023-11-21 ENCOUNTER — Ambulatory Visit (HOSPITAL_BASED_OUTPATIENT_CLINIC_OR_DEPARTMENT_OTHER): Admitting: Family Medicine

## 2023-11-21 ENCOUNTER — Encounter (HOSPITAL_BASED_OUTPATIENT_CLINIC_OR_DEPARTMENT_OTHER): Payer: Self-pay | Admitting: Family Medicine

## 2023-11-21 VITALS — BP 137/90 | HR 71 | Ht 65.0 in | Wt 263.0 lb

## 2023-11-21 DIAGNOSIS — Z7985 Long-term (current) use of injectable non-insulin antidiabetic drugs: Secondary | ICD-10-CM

## 2023-11-21 DIAGNOSIS — Z1159 Encounter for screening for other viral diseases: Secondary | ICD-10-CM

## 2023-11-21 DIAGNOSIS — I10 Essential (primary) hypertension: Secondary | ICD-10-CM

## 2023-11-21 DIAGNOSIS — E1169 Type 2 diabetes mellitus with other specified complication: Secondary | ICD-10-CM | POA: Diagnosis not present

## 2023-11-21 DIAGNOSIS — Z202 Contact with and (suspected) exposure to infections with a predominantly sexual mode of transmission: Secondary | ICD-10-CM | POA: Insufficient documentation

## 2023-11-21 DIAGNOSIS — R87810 Cervical high risk human papillomavirus (HPV) DNA test positive: Secondary | ICD-10-CM | POA: Insufficient documentation

## 2023-11-21 DIAGNOSIS — E119 Type 2 diabetes mellitus without complications: Secondary | ICD-10-CM

## 2023-11-21 DIAGNOSIS — Z114 Encounter for screening for human immunodeficiency virus [HIV]: Secondary | ICD-10-CM

## 2023-11-21 DIAGNOSIS — Z124 Encounter for screening for malignant neoplasm of cervix: Secondary | ICD-10-CM | POA: Insufficient documentation

## 2023-11-21 DIAGNOSIS — Z0000 Encounter for general adult medical examination without abnormal findings: Secondary | ICD-10-CM

## 2023-11-21 DIAGNOSIS — E785 Hyperlipidemia, unspecified: Secondary | ICD-10-CM

## 2023-11-21 LAB — POCT UA - MICROALBUMIN
Albumin/Creatinine Ratio, Urine, POC: <30
Creatinine, POC: 100 mg/dL
Microalbumin Ur, POC: 10 mg/L

## 2023-11-21 MED ORDER — TIRZEPATIDE 7.5 MG/0.5ML ~~LOC~~ SOAJ
7.5000 mg | SUBCUTANEOUS | 2 refills | Status: AC
  Filled 2023-11-21 (×2): qty 2, 28d supply, fill #0
  Filled 2024-01-01 – 2024-01-04 (×2): qty 2, 28d supply, fill #1
  Filled 2024-02-01: qty 2, 28d supply, fill #2
  Filled 2024-03-14 – 2024-03-29 (×2): qty 2, 28d supply, fill #3
  Filled 2024-04-25: qty 2, 28d supply, fill #4

## 2023-11-21 NOTE — Patient Instructions (Signed)
 Please obtain Shingrix Vaccine in Pharmacy

## 2023-11-21 NOTE — Progress Notes (Addendum)
 Subjective:   [facility] 1970/07/04  11/21/2023   CC: Chief Complaint  Patient presents with   Annual Exam    Patient is here today for her physical. Denies any concerns for today's visit.    HPI: Samantha Simpson is a 53 y.o. female who presents for a routine health maintenance exam.  Labs collected at time of visit.    HEALTH SCREENINGS: - Vision Screening: up to date - Dental Visits: Will schedule per patient  - Pap smear: Will complete per HPV + in August in 2024 - Breast Exam: Completed today  - STD Screening: Ordered today - Mammogram (40+): Up to date  - Colonoscopy (45+): Up to date  - Bone Density (65+ or under 65 with predisposing conditions): Not applicable  - Lung CA screening with low-dose CT:  Not applicable Adults age 60-80 who are current cigarette smokers or quit within the last 15 years. Must have 20 pack year history.   Depression and Anxiety Screen done today and results listed below:     11/21/2023    9:19 AM 05/29/2023    8:06 AM 12/29/2022    8:21 AM 11/16/2022    8:58 AM 05/13/2021   12:09 PM  Depression screen PHQ 2/9  Decreased Interest 0 0 0 0 0  Down, Depressed, Hopeless 0 0 0 1 0  PHQ - 2 Score 0 0 0 1 0  Altered sleeping 0 0 1 2 0  Tired, decreased energy 0 0 1 1 0  Change in appetite 0 0 0 2 0  Feeling bad or failure about yourself  0 0 1 1 0  Trouble concentrating 0 0 0 1 0  Moving slowly or fidgety/restless 0 0 0 0 0  Suicidal thoughts 0 0 0 0 0  PHQ-9 Score 0 0 3 8 0  Difficult doing work/chores Not difficult at all Not difficult at all Not difficult at all Not difficult at all Not difficult at all      11/21/2023    9:19 AM 05/29/2023    8:06 AM 12/29/2022    8:22 AM 11/16/2022    8:58 AM  GAD 7 : Generalized Anxiety Score  Nervous, Anxious, on Edge 0 0 1 0  Control/stop worrying 0 0 1 1  Worry too much - different things 0 0 1 1  Trouble relaxing 0 0 1 1  Restless 0 0 0 0  Easily annoyed or irritable 0 0 0 0   Afraid - awful might happen 0 0 0 1  Total GAD 7 Score 0 0 4 4  Anxiety Difficulty Not difficult at all Not difficult at all Not difficult at all Not difficult at all    IMMUNIZATIONS: - Tdap: Tetanus vaccination status reviewed: last tetanus booster within 10 years. - HPV: Not applicable - Influenza: Postponed to flu season - Pneumovax: Not applicable - Prevnar 20: Not applicable - Shingrix (50+): Recommended   Past medical history, surgical history, medications, allergies, family history and social history reviewed with patient today and changes made to appropriate areas of the chart.   Past Medical History:  Diagnosis Date   Acute bronchitis 06/08/2016   Allergy    Anemia    ASCUS (atypical squamous cells of undetermined significance) on Pap smear 1997   BV (bacterial vaginosis) 2001   CIN I (cervical intraepithelial neoplasia I) 2004   Diabetes mellitus without complication (HCC)    Exposure to chlamydia, mucopurulent cervitis/nongonococcal urethritis 1997   GERD (gastroesophageal  reflux disease)    H/O fatigue 2010   H/O: obesity 1997   HGSIL (high grade squamous intraepithelial dysplasia)    Hyperlipidemia    Hypertension 08/26/2020   Increased BMI 2006   Ovarian cyst, left 1996   Renal stones    Routine gynecological examination 10/19/2011   Ureteral stone with hydronephrosis 02/24/2016    Past Surgical History:  Procedure Laterality Date   [facility]     CYSTOSCOPY WITH STENT PLACEMENT Left 02/23/2016   Procedure: CYSTOSCOPY WITH STENT PLACEMENT;  Surgeon: Gretel Ferrara, MD;  Location: [facility];  Service: Urology;  Laterality: Left;   CYSTOSCOPY WITH URETEROSCOPY, STONE BASKETRY AND STENT PLACEMENT Left 03/13/2016   Procedure: CYSTOSCOPY WITH URETEROSCOPY, STONE BASKETRY AND  left ureter removal of stent;  Surgeon: Gretel Ferrara, MD;  Location: [facility];  Service: Urology;  Laterality: Left;   LAPAROSCOPY  1999   left ovary    Current Outpatient Medications on File  Prior to Visit  Medication Sig   Accu-Chek Softclix Lancets lancets 1 each by Other route QID.   amLODipine  (NORVASC ) 5 MG tablet Take 2 tablets (10 mg total) by mouth daily.   atorvastatin  (LIPITOR) 10 MG tablet Take 1 tablet (10 mg total) by mouth at bedtime.   blood glucose meter kit and supplies KIT Dispense based on patient and insurance preference. Use up to four times daily as directed. Please include lancets, test strips, control solution. Dx: E11.65   Blood Glucose Monitoring Suppl DEVI Test blood sugar twice a day   cholecalciferol (VITAMIN D3) 25 MCG (1000 UNIT) tablet    Cyanocobalamin (VITAMIN B 12 PO)    ferrous sulfate  325 (65 FE) MG EC tablet Take 1 tablet (325 mg total) by mouth daily with breakfast. Take with food to avoid stomach upset.   Glucose Blood (BLOOD GLUCOSE TEST STRIPS) STRP Use as directed to test blood sugar twice daily.   Lactobacillus Rhamnosus, GG, (CULTURELLE PO) Take by mouth.   Lancet Device MISC Tes blood sugar twice a day   pantoprazole  (PROTONIX ) 40 MG tablet Take 1 tablet (40 mg total) by mouth 2 (two) times daily.   valsartan -hydrochlorothiazide  (DIOVAN -HCT) 160-25 MG tablet Take 1 tablet by mouth daily.   No current facility-administered medications on file prior to visit.    Allergies  Allergen Reactions   Penicillins Rash    Has patient had a PCN reaction causing immediate rash, facial/tongue/throat swelling, SOB or lightheadedness with hypotension: Yes Has patient had a PCN reaction causing severe rash involving mucus membranes or skin necrosis: No Has patient had a PCN reaction that required hospitalization: No Has patient had a PCN reaction occurring within the last 10 years: No If all of the above answers are NO, then may proceed with Cephalosporin use. (Reports she can sometimes tolerate this)      Social History   Socioeconomic History   Marital status: Married    Spouse name: Not on file   Number of children: Not on file    Years of education: Not on file   Highest education level: Some college, no degree  Occupational History   Not on file  Tobacco Use   Smoking status: Never   Smokeless tobacco: Never  Substance and Sexual Activity   Alcohol use: Never   Drug use: Never   Sexual activity: Yes    Birth control/protection: None  Other Topics Concern   Not on file  Social History Narrative   Not on file   Social Drivers of  Health   Financial Resource Strain: Low Risk  (11/19/2023)   Overall Financial Resource Strain (CARDIA)    Difficulty of Paying Living Expenses: Not hard at all  Food Insecurity: No Food Insecurity (11/19/2023)   Hunger Vital Sign    Worried About Running Out of Food in the Last Year: Never true    Ran Out of Food in the Last Year: Never true  Transportation Needs: No Transportation Needs (11/19/2023)   PRAPARE - Administrator, Civil Service (Medical): No    Lack of Transportation (Non-Medical): No  Physical Activity: Inactive (11/19/2023)   Exercise Vital Sign    Days of Exercise per Week: 0 days    Minutes of Exercise per Session: Not on file  Stress: Stress Concern Present (11/19/2023)   [employer] of Occupational Health - Occupational Stress Questionnaire    Feeling of Stress: To some extent  Social Connections: Socially Integrated (11/19/2023)   Social Connection and Isolation Panel    Frequency of Communication with Friends and Family: More than three times a week    Frequency of Social Gatherings with Friends and Family: Once a week    Attends Religious Services: More than 4 times per year    Active Member of [employer] or Organizations: Yes    Attends Engineer, structural: More than 4 times per year    Marital Status: Married  Catering manager Violence: Not At Risk (05/29/2023)   Humiliation, Afraid, Rape, and Kick questionnaire    Fear of Current or Ex-Partner: No    Emotionally Abused: No    Physically Abused: No    Sexually Abused: No    Social History   Tobacco Use  Smoking Status Never  Smokeless Tobacco Never   Social History   Substance and Sexual Activity  Alcohol Use Never    Family History  Problem Relation Age of Onset   Diabetes Mother    Hypertension Mother    Arthritis Mother    Colon polyps Brother    Colon cancer Maternal Aunt    Hyperlipidemia Brother    Rectal cancer Neg Hx    Esophageal cancer Neg Hx    Stomach cancer Neg Hx      ROS: Denies fever, fatigue, unexplained weight loss/gain, chest pain, SHOB, and palpitations. Denies neurological deficits, gastrointestinal or genitourinary complaints, and skin changes.   Objective:   Today's Vitals   11/21/23 0901 11/21/23 1001  BP: (!) 142/96 (!) 137/90  Pulse: 71   SpO2: 99%   Weight: 263 lb (119.3 kg)   Height: 5' 5 (1.651 m)     GENERAL APPEARANCE: Well-appearing, in NAD. Well nourished.  SKIN: Pink, warm and dry. Turgor normal. No rash, lesion, ulceration, or ecchymoses. Hair evenly distributed.  HEENT: HEAD: Normocephalic.  EYES: PERRLA. EOMI. Lids intact w/o defect. Sclera white, Conjunctiva pink w/o exudate.  EARS: External ear w/o redness, swelling, masses or lesions. EAC clear. TM's intact, translucent w/o bulging, appropriate landmarks visualized. Appropriate acuity to conversational tones.  NOSE: Septum midline w/o deformity. Nares patent, mucosa pink and non-inflamed w/o drainage. No sinus tenderness.  THROAT: Uvula midline. Oropharynx clear. Tonsils non-inflamed w/o exudate. Oral mucosa pink and moist.  NECK: Supple, Trachea midline. Full ROM w/o pain or tenderness. No lymphadenopathy. Thyroid  non-tender w/o enlargement or palpable masses.  BREASTS: Breasts pendulous, symmetrical, and w/o palpable masses. Nipples everted and w/o discharge. No rash or skin retraction. No axillary or supraclavicular lymphadenopathy.  RESPIRATORY: Chest wall symmetrical w/o masses. Respirations even  and non-labored. Breath sounds clear to  auscultation bilaterally. No wheezes, rales, rhonchi, or crackles. CARDIAC: S1, S2 present, regular rate and rhythm. No gallops, murmurs, rubs, or clicks. PMI w/o lifts, heaves, or thrills. No carotid bruits. Capillary refill <2 seconds. Peripheral pulses 2+ bilaterally. GI: Abdomen soft w/o distention. Normoactive bowel sounds. No palpable masses or tenderness. No guarding or rebound tenderness. Liver and spleen w/o tenderness or enlargement. No CVA tenderness.  GU: External genitalia without erythema, lesions, or masses. No lymphadenopathy. Vaginal mucosa pink and moist without exudate, lesions, or ulcerations. Cervix pink with mild blood-tinged discharge. Cervical os closed. Uterus and adnexae palpable, not enlarged, and w/o tenderness. No palpable masses.  MSK: Muscle tone and strength appropriate for age, w/o atrophy or abnormal movement.  EXTREMITIES: Active ROM intact, w/o tenderness, crepitus, or contracture. No obvious joint deformities or effusions. No clubbing, edema, or cyanosis.  NEUROLOGIC: CN's II-XII intact. Motor strength symmetrical with no obvious weakness. No sensory deficits. DTR's 2+ symmetric bilaterally. Steady, even gait.  PSYCH/MENTAL STATUS: Alert, oriented x 3. Cooperative, appropriate mood and affect.   Chaperoned by Damien Many, CMA   Results for orders placed or performed in visit on 11/21/23  POCT UA - Microalbumin   Collection Time: 11/21/23 10:41 AM  Result Value Ref Range   Microalbumin Ur, POC 10 mg/L   Creatinine, POC 100 mg/dL   Albumin/Creatinine Ratio, Urine, POC <30     Assessment & Plan:  1. Annual physical exam (Primary) Discussed preventative screenings, vaccines, and healthy lifestyle with patient.    2. 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 Encounter for Papanicolaou smear for cervical cancer screening Previous Pap in 2024 was positive for untyped HPV, NILM. Recommendations to repeat pap in 1 year based upon  this result.  Pap completed today and will notify patient of results when available.  - Cytology - PAP  4. Type 2 diabetes mellitus without complication, without long-term current use of insulin (HCC) Controlled. Patient desiring to increase Mounjaro  to 7.5 mg due to glucose fluctuations and plateau in weight loss. Will increase and monitoring for hypoglycemia reviewed. Will check BMP, A1C with labs today. Urine albumin completed.  - Hemoglobin A1c - Basic metabolic panel with GFR - POCT UA - Microalbumin  5. Encounter for assessment of sexually transmitted disease exposure Asymptmatic. Sample collected with Pap smear. RPR added on per patient request.  - Cytology - PAP - RPR  6. Encounter for hepatitis C screening test for low risk patient Asymptomatic, patient requested screening.  - Hepatitis C antibody  7. Encounter for screening for human immunodeficiency virus (HIV) Asymptomatic, patient requested screening.  - HIV Antibody (routine testing w rflx)   8. Hyperlipidemia associated with type 2 diabetes mellitus (HCC) Controlled with statin therapy and recently checked in Feb. Will check annually given good control.   9. Primary hypertension BP improved with recheck. Will have patient monitor for 1-2 weeks and if not improved, will titrate medication.    Orders Placed This Encounter  Procedures   Hemoglobin A1c   Basic metabolic panel with GFR   Hepatitis C antibody   HIV Antibody (routine testing w rflx)   RPR   POCT UA - Microalbumin    PATIENT COUNSELING:  - Encouraged a healthy well-balanced diet. Patient may adjust caloric intake to maintain or achieve ideal body weight. May reduce intake of dietary saturated fat and total fat and have adequate dietary potassium and calcium  preferably from fresh fruits, vegetables, and low-fat  dairy products.   - Advised to avoid cigarette smoking. - Discussed with the patient that most people either abstain from alcohol or drink  within safe limits (<=14/week and <=4 drinks/occasion for males, <=7/weeks and <= 3 drinks/occasion for females) and that the risk for alcohol disorders and other health effects rises proportionally with the number of drinks per week and how often a drinker exceeds daily limits. - Discussed cessation/primary prevention of drug use and availability of treatment for abuse.  - Discussed sexually transmitted diseases, avoidance of unintended pregnancy and contraceptive alternatives.  - Stressed the importance of regular exercise - Injury prevention: Discussed safety belts, safety helmets, smoke detector, smoking near bedding or upholstery.  - Dental health: Discussed importance of regular tooth brushing, flossing, and dental visits.   NEXT PREVENTATIVE PHYSICAL DUE IN 1 YEAR.  Return in about 6 months (around 05/23/2024) for DIABETES CHECK UP, hyperlipidemia.  Patient to reach out to office if new, worrisome, or unresolved symptoms arise or if no improvement in patient's condition. Patient verbalized understanding and is agreeable to treatment plan. All questions answered to patient's satisfaction.    Thersia Schuyler Stark, [place]

## 2023-11-22 ENCOUNTER — Ambulatory Visit (HOSPITAL_BASED_OUTPATIENT_CLINIC_OR_DEPARTMENT_OTHER): Payer: Self-pay | Admitting: Family Medicine

## 2023-11-22 DIAGNOSIS — R87618 Other abnormal cytological findings on specimens from cervix uteri: Secondary | ICD-10-CM

## 2023-11-22 LAB — HEMOGLOBIN A1C
Est. average glucose Bld gHb Est-mCnc: 120 mg/dL
Hgb A1c MFr Bld: 5.8 % — ABNORMAL HIGH (ref 4.8–5.6)

## 2023-11-22 LAB — BASIC METABOLIC PANEL WITH GFR
BUN/Creatinine Ratio: 13 (ref 9–23)
BUN: 10 mg/dL (ref 6–24)
CO2: 24 mmol/L (ref 20–29)
Calcium: 9.7 mg/dL (ref 8.7–10.2)
Chloride: 99 mmol/L (ref 96–106)
Creatinine, Ser: 0.79 mg/dL (ref 0.57–1.00)
Glucose: 86 mg/dL (ref 70–99)
Potassium: 3.9 mmol/L (ref 3.5–5.2)
Sodium: 139 mmol/L (ref 134–144)
eGFR: 89 mL/min/1.73 (ref >59)

## 2023-11-22 LAB — HIV ANTIBODY (ROUTINE TESTING W REFLEX): HIV Screen 4th Generation wRfx: NONREACTIVE

## 2023-11-22 LAB — RPR: RPR Ser Ql: NONREACTIVE

## 2023-11-22 LAB — HEPATITIS C ANTIBODY: Hep C Virus Ab: NONREACTIVE

## 2023-11-22 NOTE — Progress Notes (Signed)
 Hi Demaya, Your A1c went up slightly to 5.8.  Please continue good dietary measures and regular exercise to help keep this under control.  Your kidney function and electrolytes are stable.  Your hep C, HIV and RPR testing was negative.  I am still awaiting her Pap smear and we will let you know the results when that is available.  Please monitor your blood pressure for the next 1 to 2 weeks and if not lower than 130/90 consistently, please reach out to the office and we will titrate your medication.

## 2023-11-23 LAB — CYTOLOGY - PAP
Adequacy: ABSENT
Chlamydia: NEGATIVE
Comment: NEGATIVE
Comment: NEGATIVE
Comment: NEGATIVE
Comment: NORMAL
Diagnosis: REACTIVE
High risk HPV: NEGATIVE
Neisseria Gonorrhea: NEGATIVE
Trichomonas: NEGATIVE

## 2023-11-24 ENCOUNTER — Encounter (HOSPITAL_BASED_OUTPATIENT_CLINIC_OR_DEPARTMENT_OTHER): Payer: Self-pay | Admitting: Family Medicine

## 2023-11-24 ENCOUNTER — Other Ambulatory Visit (HOSPITAL_BASED_OUTPATIENT_CLINIC_OR_DEPARTMENT_OTHER): Payer: Self-pay

## 2023-11-26 ENCOUNTER — Other Ambulatory Visit (HOSPITAL_BASED_OUTPATIENT_CLINIC_OR_DEPARTMENT_OTHER): Payer: Self-pay

## 2023-11-26 ENCOUNTER — Other Ambulatory Visit (HOSPITAL_BASED_OUTPATIENT_CLINIC_OR_DEPARTMENT_OTHER): Payer: Self-pay | Admitting: *Deleted

## 2023-11-26 DIAGNOSIS — E349 Endocrine disorder, unspecified: Secondary | ICD-10-CM

## 2023-11-26 MED ORDER — VALSARTAN-HYDROCHLOROTHIAZIDE 160-25 MG PO TABS
1.0000 | ORAL_TABLET | Freq: Every day | ORAL | 2 refills | Status: AC
  Filled 2023-11-26 – 2023-12-08 (×2): qty 90, 90d supply, fill #0
  Filled 2024-03-14 – 2024-03-29 (×2): qty 90, 90d supply, fill #1

## 2023-11-27 ENCOUNTER — Encounter (HOSPITAL_BASED_OUTPATIENT_CLINIC_OR_DEPARTMENT_OTHER): Payer: Commercial Managed Care - PPO | Admitting: Family Medicine

## 2023-11-28 ENCOUNTER — Telehealth: Admitting: Physician Assistant

## 2023-11-28 ENCOUNTER — Other Ambulatory Visit (HOSPITAL_BASED_OUTPATIENT_CLINIC_OR_DEPARTMENT_OTHER): Payer: Self-pay

## 2023-11-28 DIAGNOSIS — R3989 Other symptoms and signs involving the genitourinary system: Secondary | ICD-10-CM

## 2023-11-28 MED ORDER — NITROFURANTOIN MONOHYD MACRO 100 MG PO CAPS
100.0000 mg | ORAL_CAPSULE | Freq: Two times a day (BID) | ORAL | 0 refills | Status: AC
  Filled 2023-11-28: qty 10, 5d supply, fill #0

## 2023-11-28 NOTE — Progress Notes (Signed)

## 2023-12-06 ENCOUNTER — Other Ambulatory Visit (HOSPITAL_BASED_OUTPATIENT_CLINIC_OR_DEPARTMENT_OTHER): Payer: Self-pay

## 2023-12-08 ENCOUNTER — Other Ambulatory Visit (HOSPITAL_BASED_OUTPATIENT_CLINIC_OR_DEPARTMENT_OTHER): Payer: Self-pay

## 2024-01-02 ENCOUNTER — Other Ambulatory Visit (HOSPITAL_BASED_OUTPATIENT_CLINIC_OR_DEPARTMENT_OTHER): Payer: Self-pay

## 2024-01-04 ENCOUNTER — Other Ambulatory Visit (HOSPITAL_BASED_OUTPATIENT_CLINIC_OR_DEPARTMENT_OTHER): Payer: Self-pay

## 2024-01-09 ENCOUNTER — Encounter (HOSPITAL_BASED_OUTPATIENT_CLINIC_OR_DEPARTMENT_OTHER): Payer: Self-pay

## 2024-01-09 ENCOUNTER — Other Ambulatory Visit (HOSPITAL_BASED_OUTPATIENT_CLINIC_OR_DEPARTMENT_OTHER): Payer: Self-pay

## 2024-01-21 ENCOUNTER — Other Ambulatory Visit (HOSPITAL_COMMUNITY): Payer: Self-pay

## 2024-01-29 ENCOUNTER — Other Ambulatory Visit: Payer: Self-pay | Admitting: Family Medicine

## 2024-01-29 DIAGNOSIS — Z1231 Encounter for screening mammogram for malignant neoplasm of breast: Secondary | ICD-10-CM

## 2024-02-01 ENCOUNTER — Other Ambulatory Visit (HOSPITAL_BASED_OUTPATIENT_CLINIC_OR_DEPARTMENT_OTHER): Payer: Self-pay

## 2024-02-01 ENCOUNTER — Other Ambulatory Visit (HOSPITAL_BASED_OUTPATIENT_CLINIC_OR_DEPARTMENT_OTHER): Payer: Self-pay | Admitting: Family Medicine

## 2024-02-01 DIAGNOSIS — E349 Endocrine disorder, unspecified: Secondary | ICD-10-CM

## 2024-02-01 MED ORDER — AMLODIPINE BESYLATE 5 MG PO TABS
10.0000 mg | ORAL_TABLET | Freq: Every day | ORAL | 2 refills | Status: AC
  Filled 2024-02-01: qty 90, 45d supply, fill #0
  Filled 2024-04-11: qty 90, 45d supply, fill #1

## 2024-02-14 ENCOUNTER — Ambulatory Visit
Admission: RE | Admit: 2024-02-14 | Discharge: 2024-02-14 | Disposition: A | Discharge status: Home / Self Care | Source: Ambulatory Visit | Attending: Family Medicine | Admitting: Family Medicine

## 2024-02-14 DIAGNOSIS — Z1231 Encounter for screening mammogram for malignant neoplasm of breast: Secondary | ICD-10-CM

## 2024-02-18 ENCOUNTER — Ambulatory Visit (HOSPITAL_BASED_OUTPATIENT_CLINIC_OR_DEPARTMENT_OTHER): Payer: Self-pay | Admitting: Family Medicine

## 2024-02-18 NOTE — Progress Notes (Signed)
 Shatera,    Your mammogram results show no evidence of breast cancer. We will plan to repeat this in 1 year for routine screening.  If you should have concerns or changes in your breasts within the next year, please let us  know.   Thersia Stark, FNP-C

## 2024-03-14 ENCOUNTER — Other Ambulatory Visit: Payer: Self-pay

## 2024-03-14 ENCOUNTER — Other Ambulatory Visit (HOSPITAL_BASED_OUTPATIENT_CLINIC_OR_DEPARTMENT_OTHER): Payer: Self-pay

## 2024-03-14 ENCOUNTER — Other Ambulatory Visit (HOSPITAL_BASED_OUTPATIENT_CLINIC_OR_DEPARTMENT_OTHER): Payer: Self-pay | Admitting: Family Medicine

## 2024-03-14 DIAGNOSIS — K219 Gastro-esophageal reflux disease without esophagitis: Secondary | ICD-10-CM

## 2024-03-14 DIAGNOSIS — E349 Endocrine disorder, unspecified: Secondary | ICD-10-CM

## 2024-03-14 DIAGNOSIS — E1169 Type 2 diabetes mellitus with other specified complication: Secondary | ICD-10-CM

## 2024-03-14 DIAGNOSIS — E1165 Type 2 diabetes mellitus with hyperglycemia: Secondary | ICD-10-CM

## 2024-03-14 MED ORDER — PANTOPRAZOLE SODIUM 40 MG PO TBEC
40.0000 mg | DELAYED_RELEASE_TABLET | Freq: Two times a day (BID) | ORAL | 2 refills | Status: AC
  Filled 2024-03-14 – 2024-04-11 (×2): qty 90, 45d supply, fill #0

## 2024-03-14 MED ORDER — ATORVASTATIN CALCIUM 10 MG PO TABS
10.0000 mg | ORAL_TABLET | Freq: Every day | ORAL | 1 refills | Status: AC
  Filled 2024-03-14 – 2024-04-11 (×2): qty 90, 90d supply, fill #0

## 2024-03-24 ENCOUNTER — Other Ambulatory Visit (HOSPITAL_BASED_OUTPATIENT_CLINIC_OR_DEPARTMENT_OTHER): Payer: Self-pay

## 2024-04-11 ENCOUNTER — Other Ambulatory Visit (HOSPITAL_BASED_OUTPATIENT_CLINIC_OR_DEPARTMENT_OTHER): Payer: Self-pay

## 2024-05-23 ENCOUNTER — Ambulatory Visit (HOSPITAL_BASED_OUTPATIENT_CLINIC_OR_DEPARTMENT_OTHER): Admitting: Family Medicine

## 2024-11-18 ENCOUNTER — Ambulatory Visit (INDEPENDENT_AMBULATORY_CARE_PROVIDER_SITE_OTHER): Admitting: Nurse Practitioner
